# Patient Record
Sex: Female | Born: 1965
Health system: Southern US, Community
[De-identification: ages and names within clinical notes are randomized; demographics above are authoritative.]

## PROBLEM LIST (undated history)

## (undated) DIAGNOSIS — IMO0001 Reserved for inherently not codable concepts without codable children: Secondary | ICD-10-CM

## (undated) DIAGNOSIS — K929 Disease of digestive system, unspecified: Secondary | ICD-10-CM

## (undated) DIAGNOSIS — E785 Hyperlipidemia, unspecified: Secondary | ICD-10-CM

## (undated) DIAGNOSIS — S62639B Displaced fracture of distal phalanx of unspecified finger, initial encounter for open fracture: Secondary | ICD-10-CM

## (undated) DIAGNOSIS — M1990 Unspecified osteoarthritis, unspecified site: Secondary | ICD-10-CM

## (undated) DIAGNOSIS — M2550 Pain in unspecified joint: Secondary | ICD-10-CM

## (undated) HISTORY — DX: Unspecified osteoarthritis, unspecified site: M19.90

## (undated) HISTORY — DX: Displaced fracture of distal phalanx of unspecified finger, initial encounter for open fracture: S62.639B

## (undated) HISTORY — DX: Reserved for inherently not codable concepts without codable children: IMO0001

## (undated) HISTORY — DX: Hyperlipidemia, unspecified: E78.5

## (undated) HISTORY — DX: Pain in unspecified joint: M25.50

## (undated) HISTORY — DX: Disease of digestive system, unspecified: K92.9

---

## 2001-07-10 HISTORY — PX: PARTIAL HYSTERECTOMY: SHX80

## 2011-11-23 ENCOUNTER — Ambulatory Visit (INDEPENDENT_AMBULATORY_CARE_PROVIDER_SITE_OTHER): Payer: BC Managed Care – PPO | Admitting: Gynecology

## 2011-11-23 ENCOUNTER — Encounter: Payer: Self-pay | Admitting: Gynecology

## 2011-11-23 ENCOUNTER — Other Ambulatory Visit (HOSPITAL_COMMUNITY)
Admission: RE | Admit: 2011-11-23 | Discharge: 2011-11-23 | Disposition: A | Discharge status: Home / Self Care | Payer: BC Managed Care – PPO | Source: Ambulatory Visit | Attending: Gynecology | Admitting: Gynecology

## 2011-11-23 VITALS — BP 124/70 | Ht 61.5 in | Wt 118.0 lb

## 2011-11-23 DIAGNOSIS — Z01419 Encounter for gynecological examination (general) (routine) without abnormal findings: Secondary | ICD-10-CM

## 2011-11-23 DIAGNOSIS — Z1159 Encounter for screening for other viral diseases: Secondary | ICD-10-CM | POA: Insufficient documentation

## 2011-11-23 DIAGNOSIS — Z131 Encounter for screening for diabetes mellitus: Secondary | ICD-10-CM

## 2011-11-23 NOTE — Progress Notes (Signed)
Kathy Hodges 1965/12/29 [id number]        46 y.o.  G5 P4 Ab1 new patient for annual exam.  It's been several years since her last exam and she is without complaints.  Past medical history,surgical history, medications, allergies, family history and social history were all reviewed and documented in the EPIC chart. ROS:  Was performed and pertinent positives and negatives are included in the history.  Exam: Sherrilyn Rist chaperone present Filed Vitals:   11/23/11 1201  BP: 124/70   General appearance  Normal Skin grossly normal Head/Neck normal with no cervical or supraclavicular adenopathy thyroid normal Lungs  clear Cardiac RR, without RMG Abdominal  soft, nontender, without masses, organomegaly or hernia Breasts  examined lying and sitting without masses, retractions, discharge or axillary adenopathy. Pelvic  Ext/BUS/vagina  normal Pap of cuff done  Adnexa  Without masses or tenderness    Anus and perineum  normal   Rectovaginal  normal sphincter tone without palpated masses or tenderness.    Assessment/Plan:  46 y.o. female for annual exam.    1. History of abdominal hysterectomy for menorrhagia with abdominoplasty and hernia repair 2003. Doing well without menopausal symptoms. 2. Pap smear. She has no history of abnormal Pap smears before. No records of these. I did a Pap with HPV as a screen. Assuming this is negative I discussed stopping screening per current screening guidelines and she is status post hysterectomy for benign indications. 3. Mammography. Patient had her mammogram September 2012. To continue with annual mammography. SBE monthly reviewed. 4. Health maintenance. Baseline CBC glucose urinalysis ordered. She did have a lipid profile which was normal done to a cardiologist within the past year that she saw due to a brothers history of premature cardiac disease and her workup was negative. Assuming she continues well from a gynecologic standpoint she will see me in a year, sooner as  needed    Dara Lords MD, 12:44 PM 11/23/2011

## 2011-11-23 NOTE — Patient Instructions (Signed)
Follow up in one year for annual exam 

## 2011-11-24 LAB — CBC WITH DIFFERENTIAL/PLATELET
Basophils Absolute: 0 10*3/uL (ref 0.0–0.1)
Basophils Relative: 0 % (ref 0–1)
Eosinophils Relative: 1 % (ref 0–5)
HCT: 41.1 % (ref 36.0–46.0)
Hemoglobin: 12.9 g/dL (ref 12.0–15.0)
Lymphocytes Relative: 20 % (ref 12–46)
MCHC: 31.4 g/dL (ref 30.0–36.0)
MCV: 98.1 fL (ref 78.0–100.0)
Monocytes Absolute: 0.7 10*3/uL (ref 0.1–1.0)
Monocytes Relative: 9 % (ref 3–12)
Neutro Abs: 5.3 10*3/uL (ref 1.7–7.7)
RDW: 14.1 % (ref 11.5–15.5)

## 2011-11-24 LAB — URINALYSIS W MICROSCOPIC + REFLEX CULTURE
Bilirubin Urine: NEGATIVE
Casts: NONE SEEN
Glucose, UA: NEGATIVE mg/dL
Leukocytes, UA: NEGATIVE
Squamous Epithelial / LPF: NONE SEEN
pH: 5.5 (ref 5.0–8.0)

## 2012-05-13 ENCOUNTER — Emergency Department
Admission: EM | Admit: 2012-05-13 | Discharge: 2012-05-13 | Disposition: A | Discharge status: Home / Self Care | Payer: BC Managed Care – PPO | Source: Home / Self Care

## 2012-05-13 DIAGNOSIS — Z23 Encounter for immunization: Secondary | ICD-10-CM

## 2012-05-13 DIAGNOSIS — S62639B Displaced fracture of distal phalanx of unspecified finger, initial encounter for open fracture: Secondary | ICD-10-CM

## 2012-05-13 HISTORY — DX: Displaced fracture of distal phalanx of unspecified finger, initial encounter for open fracture: S62.639B

## 2012-05-13 MED ORDER — TETANUS-DIPHTH-ACELL PERTUSSIS 5-2.5-18.5 LF-MCG/0.5 IM SUSP
0.5000 mL | Freq: Once | INTRAMUSCULAR | Status: AC
  Administered 2012-05-13: 0.5 mL via INTRAMUSCULAR

## 2012-05-13 MED ORDER — INFLUENZA VAC TYP A&B SURF ANT IM INJ
0.5000 mL | INJECTION | INTRAMUSCULAR | Status: AC
  Administered 2012-05-13: 0.5 mL via INTRAMUSCULAR

## 2012-05-13 NOTE — ED Notes (Signed)
Kathy Hodges is here today for a flu and tetanus vaccine.

## 2012-05-29 DIAGNOSIS — IMO0001 Reserved for inherently not codable concepts without codable children: Secondary | ICD-10-CM

## 2012-05-29 HISTORY — DX: Reserved for inherently not codable concepts without codable children: IMO0001

## 2013-11-11 ENCOUNTER — Encounter: Payer: Self-pay | Admitting: Gynecology

## 2013-11-12 ENCOUNTER — Encounter: Payer: Self-pay | Admitting: Family Medicine

## 2013-11-12 ENCOUNTER — Ambulatory Visit (INDEPENDENT_AMBULATORY_CARE_PROVIDER_SITE_OTHER): Payer: BC Managed Care – PPO | Admitting: Family Medicine

## 2013-11-12 VITALS — BP 125/81 | HR 62 | Ht 61.5 in | Wt 115.0 lb

## 2013-11-12 DIAGNOSIS — R1011 Right upper quadrant pain: Secondary | ICD-10-CM

## 2013-11-12 DIAGNOSIS — Z13220 Encounter for screening for lipoid disorders: Secondary | ICD-10-CM

## 2013-11-12 DIAGNOSIS — R109 Unspecified abdominal pain: Secondary | ICD-10-CM

## 2013-11-12 DIAGNOSIS — D126 Benign neoplasm of colon, unspecified: Secondary | ICD-10-CM

## 2013-11-12 LAB — GAMMA GT: GGT: 11 U/L (ref 7–51)

## 2013-11-12 LAB — COMPLETE METABOLIC PANEL WITH GFR
ALBUMIN: 4.7 g/dL (ref 3.5–5.2)
ALK PHOS: 50 U/L (ref 39–117)
ALT: 11 U/L (ref 0–35)
AST: 16 U/L (ref 0–37)
BUN: 12 mg/dL (ref 6–23)
CALCIUM: 9.8 mg/dL (ref 8.4–10.5)
CHLORIDE: 101 meq/L (ref 96–112)
CO2: 22 mEq/L (ref 19–32)
Creat: 0.78 mg/dL (ref 0.50–1.10)
GFR, Est African American: >89 mL/min
GFR, Est Non African American: >89 mL/min
Glucose, Bld: 90 mg/dL (ref 70–99)
Potassium: 4.4 mEq/L (ref 3.5–5.3)
Sodium: 138 mEq/L (ref 135–145)
Total Bilirubin: 0.5 mg/dL (ref 0.2–1.2)
Total Protein: 7.6 g/dL (ref 6.0–8.3)

## 2013-11-12 NOTE — Progress Notes (Signed)
CC: Kathy Hodges is a 48 y.o. female is here for Establish Care   Subjective: HPI:  Very pleasant 48 year old here to establish care  Patient complains of generalized abdominal pain that has been present for the past 2 months it is described as moderate to severe occurs after eating certain foods and is described as a sickness and cramping. It can occur anytime of the day however has never woken her from sleep. It is worse with eating ham, yogurt, cheese burgers, french fries, fatty foods, or cheap cereal.  She is at a point now where her diet consists mostly of bread and water with occasional raisin bran occasional milk, while on this diet symptoms are manageable. Symptoms are accompanied by diarrhea which begins minutes after eating but does improve her pain to some degree. Symptoms came on abruptly. Interestingly she had identical symptoms approximately 20 years ago where she again had to eat nothing but bread and water for almost 2 years before symptoms resolved spontaneously without any intervention. She was given a remote diagnosis of pancreatic insufficiency but has never required enzyme supplementation.  Reports occasional nausea but no vomiting.  On direct questioning she does localize the majority of her pain to the right upper quadrant.  Interestingly she has a family history of polyposis with multiple family members require resection of her large colon. She had a colonoscopy in her early 31s or late teens that was entirely normal per her report   Review of Systems - General ROS: negative for - chills, fever, night sweats, weight gain or weight loss Ophthalmic ROS: negative for - decreased vision Psychological ROS: negative for - anxiety or depression ENT ROS: negative for - hearing change, nasal congestion, tinnitus or allergies Hematological and Lymphatic ROS: negative for - bleeding problems, bruising or swollen lymph nodes Breast ROS: negative Respiratory ROS: no cough, shortness of  breath, or wheezing Cardiovascular ROS: no chest pain or dyspnea on exertion Gastrointestinal ROS: no black or bloody stools Genito-Urinary ROS: negative for - genital discharge, genital ulcers, incontinence or abnormal bleeding from genitals Musculoskeletal ROS: negative for - joint pain or muscle pain Neurological ROS: negative for - headaches or memory loss Dermatological ROS: negative for lumps, mole changes, rash and skin lesion changes  Past Medical History  Diagnosis Date  . Hyperlipidemia   . Familial polyposis 11/12/2013    Past Surgical History  Procedure Laterality Date  . Cesarean section      x2  . Abdominal hysterectomy  2003    with ab muscle repair and hernia repair and bladder tacting for menorrahgia  . Partial hysterectomy  01/2002  . Bladder tack     Family History  Problem Relation Age of Onset  . Heart disease Father   . Other Father     familial polyposis  . Heart disease Brother 31    MI  . Other Brother     familial polyposis x 3  . Other Sister     familial polyposis x3    History   Social History  . Marital Status: Married    Spouse Name: N/A    Number of Children: N/A  . Years of Education: N/A   Occupational History  . Not on file.   Social History Main Topics  . Smoking status: Never Smoker   . Smokeless tobacco: Never Used  . Alcohol Use: Yes     Comment: periodically  . Drug Use: No  . Sexual Activity: Yes    Partners: Male  Birth Control/ Protection: Surgical   Other Topics Concern  . Not on file   Social History Narrative  . No narrative on file     Objective: BP 125/81  Pulse 62  Ht 5' 1.5" (1.562 m)  Wt 115 lb (52.164 kg)  BMI 21.38 kg/m2  General: Alert and Oriented, No Acute Distress HEENT: Pupils equal, round, reactive to light. Conjunctivae clear.  Moist membranes pharynx unremarkable Lungs: Clear to auscultation bilaterally, no wheezing/ronchi/rales.  Comfortable work of breathing. Good air  movement. Cardiac: Regular rate and rhythm. Normal S1/S2.  No murmurs, rubs, nor gallops.   Abdomen: Murphy sign positive, normal bowel sounds, no palpable masses in any quadrant. She is mild to moderately tender in all 4 quadrants without rebound guarding or rigidity Extremities: No peripheral edema.  Strong peripheral pulses.  Mental Status: No depression, anxiety, nor agitation. Skin: Warm and dry.  Assessment & Plan: Chayce was seen today for establish care.  Diagnoses and associated orders for this visit:  Familial polyposis  Abdominal pain, unspecified site - COMPLETE METABOLIC PANEL WITH GFR - Gamma GT - US Abdomen Complete; Future  Lipid screening - Lipid panel  RUQ pain - US Abdomen Complete; Future    Abdominal pain: Will rule out cholelithiasis/cholecystitis with ultrasound and labs above. If the above does not determine the source of the patient's discomfort we'll refer to gastroenterology for consideration of colonoscopy/endoscopy  Aside from above she is due for routine dyslipidemia screen which she will obtain headache future date.  Return if symptoms worsen or fail to improve.

## 2013-11-13 ENCOUNTER — Ambulatory Visit (INDEPENDENT_AMBULATORY_CARE_PROVIDER_SITE_OTHER): Payer: BC Managed Care – PPO

## 2013-11-13 ENCOUNTER — Telehealth: Payer: Self-pay | Admitting: Family Medicine

## 2013-11-13 ENCOUNTER — Telehealth: Payer: Self-pay | Admitting: *Deleted

## 2013-11-13 ENCOUNTER — Encounter: Payer: Self-pay | Admitting: Gastroenterology

## 2013-11-13 DIAGNOSIS — D126 Benign neoplasm of colon, unspecified: Secondary | ICD-10-CM

## 2013-11-13 DIAGNOSIS — R1011 Right upper quadrant pain: Secondary | ICD-10-CM

## 2013-11-13 DIAGNOSIS — R109 Unspecified abdominal pain: Secondary | ICD-10-CM

## 2013-11-13 DIAGNOSIS — R197 Diarrhea, unspecified: Secondary | ICD-10-CM

## 2013-11-13 LAB — LIPID PANEL
CHOL/HDL RATIO: 3.1 ratio
CHOLESTEROL: 192 mg/dL (ref 0–200)
HDL: 61 mg/dL (ref >39)
LDL Cholesterol: 117 mg/dL — ABNORMAL HIGH (ref 0–99)
Triglycerides: 68 mg/dL (ref <150)
VLDL: 14 mg/dL (ref 0–40)

## 2013-11-13 NOTE — Telephone Encounter (Signed)
Closed

## 2013-11-13 NOTE — Telephone Encounter (Signed)
Pt notified and would like to be scheduled with Dr. [name]. Note added to referral

## 2013-11-13 NOTE — Telephone Encounter (Signed)
Seth Bake, Will you please let patient know that labs and [place] did not show any abnormality. Although this is reassuring it doesn't explain her diarrhea therefore I've placed a GI referral for further workup.  Please let me know if not contacted about scheduling by next week.

## 2013-12-11 ENCOUNTER — Ambulatory Visit (INDEPENDENT_AMBULATORY_CARE_PROVIDER_SITE_OTHER): Payer: BC Managed Care – PPO | Admitting: Gynecology

## 2013-12-11 ENCOUNTER — Telehealth: Payer: Self-pay | Admitting: *Deleted

## 2013-12-11 ENCOUNTER — Encounter: Payer: Self-pay | Admitting: Gynecology

## 2013-12-11 VITALS — BP 116/74 | Ht 61.0 in | Wt 115.0 lb

## 2013-12-11 DIAGNOSIS — Z01419 Encounter for gynecological examination (general) (routine) without abnormal findings: Secondary | ICD-10-CM

## 2013-12-11 NOTE — Progress Notes (Signed)
Kathy Hodges [birth year] [id number]        48 y.o.  [id number] for annual exam.  Several issues that are below.  Past medical history,surgical history, problem list, medications, allergies, family history and social history were all reviewed and documented as reviewed in the EPIC chart.  ROS:  12 system ROS performed with pertinent positives and negatives included in the history, assessment and plan.  Included Systems: General, HEENT, Neck, Cardiovascular, Pulmonary, Gastrointestinal, Genitourinary, Musculoskeletal, Dermatologic, Endocrine, Hematological, Neurologic, Psychiatric Additional significant findings :  none   Exam: Kim assistant Filed Vitals:   12/11/13 1201  BP: 116/74  Height: 5\' 1"  (1.549 m)  Weight: 115 lb (52.164 kg)   General appearance:  Normal affect, orientation and appearance. Skin: Grossly normal HEENT: Without gross lesions.  No cervical or supraclavicular adenopathy. Thyroid normal.  Lungs:  Clear without wheezing, rales or rhonchi Cardiac: RR, without RMG Abdominal:  Soft, nontender, without masses, guarding, rebound, organomegaly or hernia Breasts:  Examined lying and sitting without masses, retractions, discharge or axillary adenopathy. Pelvic:  Ext/BUS/vagina normal   Adnexa  Without masses or tenderness    Anus and perineum  Normal   Rectovaginal  Normal sphincter tone without palpated masses or tenderness.    Assessment/Plan:  48 y.o. [id number] female for annual exam.   1. History of abdominal hysterectomy for menorrhagia and abdominal plasty and hernia repair 2003. Doing well without menopausal symptoms. Will continue to monitor. 2. Pap smear/HPV negative 2013. No Pap smear done today. Reviewed current screening guidelines. Options to stop screening she status post hysterectomy for benign indications versus less frequent screening intervals reviewed. Will readdress on an annual basis. No history of significant abnormal Pap smears previously. 3. History of  familial polyposis. Apparently was checked genetically a number of years ago but there is some question as to the results. Recommend followup with genetic counselor now. I reviewed the issue of Lynch syndrome and we will have genetic counselor see her to clarify the issue. 4. Mammography 11/2013. Continue with annual mammography. SBE monthly review. 5. Health maintenance. Recently had comprehensivemetabolic panelcomment lipid profile done. Mild elevated LDL discussed with her. Will check baseline CBC and urinalysis.  Followup with appointment for genetic counseling otherwise annually.   Note: This document was prepared with digital dictation and possible smart phrase technology. Any transcriptional errors that result from this process are unintentional.   Anastasio Auerbach MD, 12:22 PM 12/11/2013

## 2013-12-11 NOTE — Telephone Encounter (Signed)
Message copied by Thamas Jaegers on Thu Dec 11, 2013  2:09 PM ------      Message from: Anastasio Auerbach      Created: Thu Dec 11, 2013 12:26 PM       Appointment with oncology genetic counselor reference family history of colonic polyposis unclear about testing history. Rule out Lynch syndrome ------

## 2013-12-11 NOTE — Patient Instructions (Signed)
Office will contact you to arrange appointment with genetic counselor.  You may obtain a copy of any labs that were done today by logging onto MyChart as outlined in the instructions provided with your AVS (after visit summary). The office will not call with normal lab results but certainly if there are any significant abnormalities then we will contact you.   Health Maintenance, Female A healthy lifestyle and preventative care can promote health and wellness.  Maintain regular health, dental, and eye exams.  Eat a healthy diet. Foods like vegetables, fruits, whole grains, low-fat dairy products, and lean protein foods contain the nutrients you need without too many calories. Decrease your intake of foods high in solid fats, added sugars, and salt. Get information about a proper diet from your caregiver, if necessary.  Regular physical exercise is one of the most important things you can do for your health. Most adults should get at least 150 minutes of moderate-intensity exercise (any activity that increases your heart rate and causes you to sweat) each week. In addition, most adults need muscle-strengthening exercises on 2 or more days a week.   Maintain a healthy weight. The body mass index (BMI) is a screening tool to identify possible weight problems. It provides an estimate of body fat based on height and weight. Your caregiver can help determine your BMI, and can help you achieve or maintain a healthy weight. For adults 20 years and older:  A BMI below 18.5 is considered underweight.  A BMI of 18.5 to 24.9 is normal.  A BMI of 25 to 29.9 is considered overweight.  A BMI of 30 and above is considered obese.  Maintain normal blood lipids and cholesterol by exercising and minimizing your intake of saturated fat. Eat a balanced diet with plenty of fruits and vegetables. Blood tests for lipids and cholesterol should begin at age 98 and be repeated every 5 years. If your lipid or cholesterol  levels are high, you are over 50, or you are a high risk for heart disease, you may need your cholesterol levels checked more frequently.Ongoing high lipid and cholesterol levels should be treated with medicines if diet and exercise are not effective.  If you smoke, find out from your caregiver how to quit. If you do not use tobacco, do not start.  Lung cancer screening is recommended for adults aged 83 80 years who are at high risk for developing lung cancer because of a history of smoking. Yearly low-dose computed tomography (CT) is recommended for people who have at least a 30-pack-year history of smoking and are a current smoker or have quit within the past 15 years. A pack year of smoking is smoking an average of 1 pack of cigarettes a day for 1 year (for example: 1 pack a day for 30 years or 2 packs a day for 15 years). Yearly screening should continue until the smoker has stopped smoking for at least 15 years. Yearly screening should also be stopped for people who develop a health problem that would prevent them from having lung cancer treatment.  If you are pregnant, do not drink alcohol. If you are breastfeeding, be very cautious about drinking alcohol. If you are not pregnant and choose to drink alcohol, do not exceed 1 drink per day. One drink is considered to be 12 ounces (355 mL) of beer, 5 ounces (148 mL) of wine, or 1.5 ounces (44 mL) of liquor.  Avoid use of street drugs. Do not share needles with anyone.  Ask for help if you need support or instructions about stopping the use of drugs.  High blood pressure causes heart disease and increases the risk of stroke. Blood pressure should be checked at least every 1 to 2 years. Ongoing high blood pressure should be treated with medicines, if weight loss and exercise are not effective.  If you are 18 to 48 years old, ask your caregiver if you should take aspirin to prevent strokes.  Diabetes screening involves taking a blood sample to check  your fasting blood sugar level. This should be done once every 3 years, after age 41, if you are within normal weight and without risk factors for diabetes. Testing should be considered at a younger age or be carried out more frequently if you are overweight and have at least 1 risk factor for diabetes.  Breast cancer screening is essential preventative care for women. You should practice "breast self-awareness." This means understanding the normal appearance and feel of your breasts and may include breast self-examination. Any changes detected, no matter how small, should be reported to a caregiver. Women in their 27s and 30s should have a clinical breast exam (CBE) by a caregiver as part of a regular health exam every 1 to 3 years. After age 38, women should have a CBE every year. Starting at age 1, women should consider having a mammogram (breast X-ray) every year. Women who have a family history of breast cancer should talk to their caregiver about genetic screening. Women at a high risk of breast cancer should talk to their caregiver about having an MRI and a mammogram every year.  Breast cancer gene (BRCA)-related cancer risk assessment is recommended for women who have family members with BRCA-related cancers. BRCA-related cancers include breast, ovarian, tubal, and peritoneal cancers. Having family members with these cancers may be associated with an increased risk for harmful changes (mutations) in the breast cancer genes BRCA1 and BRCA2. Results of the assessment will determine the need for genetic counseling and BRCA1 and BRCA2 testing.  The Pap test is a screening test for cervical cancer. Women should have a Pap test starting at age 3. Between ages 87 and 43, Pap tests should be repeated every 2 years. Beginning at age 47, you should have a Pap test every 3 years as long as the past 3 Pap tests have been normal. If you had a hysterectomy for a problem that was not cancer or a condition that  could lead to cancer, then you no longer need Pap tests. If you are between ages 1 and 72, and you have had normal Pap tests going back 10 years, you no longer need Pap tests. If you have had past treatment for cervical cancer or a condition that could lead to cancer, you need Pap tests and screening for cancer for at least 20 years after your treatment. If Pap tests have been discontinued, risk factors (such as a new sexual partner) need to be reassessed to determine if screening should be resumed. Some women have medical problems that increase the chance of getting cervical cancer. In these cases, your caregiver may recommend more frequent screening and Pap tests.  The human papillomavirus (HPV) test is an additional test that may be used for cervical cancer screening. The HPV test looks for the virus that can cause the cell changes on the cervix. The cells collected during the Pap test can be tested for HPV. The HPV test could be used to screen women aged 75 years  and older, and should be used in women of any age who have unclear Pap test results. After the age of 30, women should have HPV testing at the same frequency as a Pap test.  Colorectal cancer can be detected and often prevented. Most routine colorectal cancer screening begins at the age of 77 and continues through age 49. However, your caregiver may recommend screening at an earlier age if you have risk factors for colon cancer. On a yearly basis, your caregiver may provide home test kits to check for hidden blood in the stool. Use of a small camera at the end of a tube, to directly examine the colon (sigmoidoscopy or colonoscopy), can detect the earliest forms of colorectal cancer. Talk to your caregiver about this at age 45, when routine screening begins. Direct examination of the colon should be repeated every 5 to 10 years through age 28, unless early forms of pre-cancerous polyps or small growths are found.  Hepatitis C blood testing is  recommended for all people born from [birth year] through 1965 and any individual with known risks for hepatitis C.  Practice safe sex. Use condoms and avoid high-risk sexual practices to reduce the spread of sexually transmitted infections (STIs). Sexually active women aged 73 and younger should be checked for Chlamydia, which is a common sexually transmitted infection. Older women with new or multiple partners should also be tested for Chlamydia. Testing for other STIs is recommended if you are sexually active and at increased risk.  Osteoporosis is a disease in which the bones lose minerals and strength with aging. This can result in serious bone fractures. The risk of osteoporosis can be identified using a bone density scan. Women ages 71 and over and women at risk for fractures or osteoporosis should discuss screening with their caregivers. Ask your caregiver whether you should be taking a calcium supplement or vitamin D to reduce the rate of osteoporosis.  Menopause can be associated with physical symptoms and risks. Hormone replacement therapy is available to decrease symptoms and risks. You should talk to your caregiver about whether hormone replacement therapy is right for you.  Use sunscreen. Apply sunscreen liberally and repeatedly throughout the day. You should seek shade when your shadow is shorter than you. Protect yourself by wearing long sleeves, pants, a wide-brimmed hat, and sunglasses year round, whenever you are outdoors.  Notify your caregiver of new moles or changes in moles, especially if there is a change in shape or color. Also notify your caregiver if a mole is larger than the size of a pencil eraser.  Stay current with your immunizations. Document Released: 01/09/2011 Document Revised: 10/21/2012 Document Reviewed: 01/09/2011 [facility] Patient Information 2014 [place].

## 2013-12-11 NOTE — Telephone Encounter (Signed)
Referral faxed to cancer center, they will contact patient to schedule. 

## 2013-12-12 ENCOUNTER — Telehealth: Payer: Self-pay | Admitting: Genetic Counselor

## 2013-12-12 LAB — URINALYSIS W MICROSCOPIC + REFLEX CULTURE
Bacteria, UA: NONE SEEN
Bilirubin Urine: NEGATIVE
Casts: NONE SEEN
Crystals: NONE SEEN
Glucose, UA: NEGATIVE mg/dL
Hgb urine dipstick: NEGATIVE
Ketones, ur: NEGATIVE mg/dL
Leukocytes, UA: NEGATIVE
Nitrite: NEGATIVE
Protein, ur: NEGATIVE mg/dL
Specific Gravity, Urine: 1.005 (ref 1.005–1.030)
Squamous Epithelial / HPF: NONE SEEN
Urobilinogen, UA: 0.2 mg/dL (ref 0.0–1.0)
pH: 6.5 (ref 5.0–8.0)

## 2013-12-12 NOTE — Telephone Encounter (Signed)
Appointment on 01/06/14 at [facility].

## 2013-12-12 NOTE — Telephone Encounter (Signed)
S/W PATIENT AND GAVE NP APPT FOR 06/30 @ 9 W/GENETIC COUNSELOR.  REFERRING DR. Phineas Real DX- FHX OF COLON CA WELCOME PACKET MAILED.

## 2014-01-06 ENCOUNTER — Other Ambulatory Visit: Payer: BC Managed Care – PPO

## 2014-01-06 ENCOUNTER — Ambulatory Visit (HOSPITAL_BASED_OUTPATIENT_CLINIC_OR_DEPARTMENT_OTHER): Payer: BC Managed Care – PPO | Admitting: Genetic Counselor

## 2014-01-06 ENCOUNTER — Encounter: Payer: Self-pay | Admitting: Genetic Counselor

## 2014-01-06 DIAGNOSIS — Z8371 Family history of colonic polyps: Secondary | ICD-10-CM

## 2014-01-06 NOTE — Progress Notes (Signed)
Patient Name: Kathy Hodges Patient Age: 48 y.o. Encounter Date: 01/06/2014  Referring Physician: Donalynn Furlong, MD  Primary Care Provider: Marcial Pacas, DO   [name], a 48 y.o. female, is being seen at the [facility] due to a family history of Familial Adenomatous Polyposis (FAP) and a pathogenic APC mutation in her family.  She presents to clinic today to obtain information about FAP and discuss genetic testing.  HISTORY OF PRESENT ILLNESS: [name] has no personal history of cancer. She stated that she used to have yearly colon screening when she was in her teens and 57s, but that her last colonoscopy was at age 76. It was negative for polyps and she was told to discontinue yearly screening. She has not had genetic testing for the familial APC mutation identified in several of her siblings: APC 65insT (old nomenclature).  She stated that she has a yearly gynecologic exam and clinical breast exam. She has a mammogram every 1-2 years.  Past Medical History  Diagnosis Date  . Hyperlipidemia   . Digestive problems   . Family history of FAP (familial adenomatous polyposis)     siblings with APC mutation    Past Surgical History  Procedure Laterality Date  . Cesarean section      x2  . Abdominal hysterectomy  2003    with ab muscle repair and hernia repair and bladder tacting for menorrahgia  . Partial hysterectomy  01/2002  . Bladder tack      History   Social History  . Marital Status: Married    Spouse Name: N/A    Number of Children: N/A  . Years of Education: N/A   Social History Main Topics  . Smoking status: Never Smoker   . Smokeless tobacco: Never Used  . Alcohol Use: Yes     Comment: periodically  . Drug Use: No  . Sexual Activity: Yes    Partners: Male    Birth Control/ Protection: Surgical     Comment: HYST   Other Topics Concern  . Not on file   Social History Narrative  . No narrative on file     FAMILY HISTORY:   During  the visit, a 4-generation pedigree was obtained. Significant diagnoses include the following:  Family History  Problem Relation Age of Onset  . Heart disease Father   . Other Father     familial polyposis  . Heart disease Brother 76    MI  . Other Brother     2 brothers with FAP; colectomy in teens; APC positive  . Other Sister     4 sisters with FAP; colectomy in teens; APC positive  . Colon cancer Paternal Uncle 65    deceased    Additionally, [name] has 2 sons (ages 43 and 46) and 2 daughters (ages 35 and 86).   [name] ancestry is [place] and [place]. There is no known Jewish ancestry and no consanguinity.  ASSESSMENT AND PLAN: [name] is a 48 y.o. female with a family history of FAP and a pathogenic APC mutation identified in several siblings. Her last colon screening was at age 8 and she has not had genetic testing. We discussed that while she likely did not inherit the mutation given her history of negative colon screening, testing is strongly recommended to confirm this. We reviewed the characteristics, features and inheritance patterns of FAP. We also discussed genetic testing, including the other appropriate family members to test, the process of testing, insurance  coverage and implications of results. She understood that if her test is negative, there is no reason to offer testing to her children.  [name] wished to pursue genetic testing and a blood sample will be sent to [facility] for site-specific analysis for the familial APC mutation. We discussed the implications of a positive and negative result. Results should be available in approximately 4 weeks, at which point we will contact her and address implications for her as well as address genetic testing for at-risk family members, as needed.    We encouraged [name] to remain in contact with Cancer Genetics annually so that we can update the family history and inform her of any changes in cancer genetics and  testing that may be of benefit for this family. Ms.  Kathy Hodges questions were answered to her satisfaction today.   Thank you for the referral and allowing [place] to share in the care of your patient.   The patient was seen for a total of 30 minutes, greater than 50% of which was spent face-to-face counseling. This patient was discussed with the overseeing provider who agrees with the above.

## 2014-01-11 ENCOUNTER — Encounter: Payer: Self-pay | Admitting: Family Medicine

## 2014-01-19 ENCOUNTER — Other Ambulatory Visit (INDEPENDENT_AMBULATORY_CARE_PROVIDER_SITE_OTHER): Payer: BC Managed Care – PPO

## 2014-01-19 ENCOUNTER — Encounter: Payer: Self-pay | Admitting: Gastroenterology

## 2014-01-19 ENCOUNTER — Ambulatory Visit (INDEPENDENT_AMBULATORY_CARE_PROVIDER_SITE_OTHER): Payer: BC Managed Care – PPO | Admitting: Gastroenterology

## 2014-01-19 VITALS — BP 110/60 | HR 78 | Ht 61.0 in | Wt 115.0 lb

## 2014-01-19 DIAGNOSIS — R1031 Right lower quadrant pain: Secondary | ICD-10-CM

## 2014-01-19 LAB — COMPREHENSIVE METABOLIC PANEL
ALK PHOS: 42 U/L (ref 39–117)
ALT: 15 U/L (ref 0–35)
AST: 18 U/L (ref 0–37)
Albumin: 4.3 g/dL (ref 3.5–5.2)
BUN: 12 mg/dL (ref 6–23)
CHLORIDE: 106 meq/L (ref 96–112)
CO2: 26 meq/L (ref 19–32)
CREATININE: 0.8 mg/dL (ref 0.4–1.2)
Calcium: 9.3 mg/dL (ref 8.4–10.5)
GFR: 87.68 mL/min (ref >60.00)
GLUCOSE: 105 mg/dL — AB (ref 70–99)
POTASSIUM: 4 meq/L (ref 3.5–5.1)
SODIUM: 139 meq/L (ref 135–145)
Total Bilirubin: 0.8 mg/dL (ref 0.2–1.2)
Total Protein: 7.5 g/dL (ref 6.0–8.3)

## 2014-01-19 LAB — CBC WITH DIFFERENTIAL/PLATELET
BASOS ABS: 0 10*3/uL (ref 0.0–0.1)
Basophils Relative: 0 % (ref 0.0–3.0)
EOS PCT: 0.4 % (ref 0.0–5.0)
Eosinophils Absolute: 0 10*3/uL (ref 0.0–0.7)
HCT: 42.4 % (ref 36.0–46.0)
Hemoglobin: 13.7 g/dL (ref 12.0–15.0)
LYMPHS ABS: 1.3 10*3/uL (ref 0.7–4.0)
LYMPHS PCT: 23.2 % (ref 12.0–46.0)
MCHC: 32.3 g/dL (ref 30.0–36.0)
MCV: 94.3 fl (ref 78.0–100.0)
MONOS PCT: 7.9 % (ref 3.0–12.0)
Monocytes Absolute: 0.4 10*3/uL (ref 0.1–1.0)
Neutro Abs: 3.9 10*3/uL (ref 1.4–7.7)
Neutrophils Relative %: 68.5 % (ref 43.0–77.0)
PLATELETS: 195 10*3/uL (ref 150.0–400.0)
RBC: 4.49 Mil/uL (ref 3.87–5.11)
RDW: 14.5 % (ref 11.5–15.5)
WBC: 5.6 10*3/uL (ref 4.0–10.5)

## 2014-01-19 LAB — SEDIMENTATION RATE: Sed Rate: 10 mm/hr (ref 0–22)

## 2014-01-19 NOTE — Progress Notes (Signed)
HPI: This is a   very pleasant 48 year old woman whom I am meeting for the first time today.   Siblings with APC mutation.  She had genetic testing done 2 weeks ago.  Colonoscopy 20 years ago was normal.  From age [age] she had them annually and they were all normal.  She stopped having colonoscopy since no polyps ever noted.  Her uncle had colon cancer. 9 siblings: 6 of them have had their colons removed.  A niece has FAP.  She has had right lower quad pains, yogurt and lunchmeat. Eating particular foods cause the pains (high fat pains, grapes).  Often within minutes of eating.    20 years ago, diarrhea as well; elimination diet.  Ate bread a and water only for about two years.  Currently she has alternating loose, constipated stools. No overt GI bleeding.  Overall stable weight (maybe 4-5 pounds down in 6 months).  No fevers or chills.  [place] recently 11/2013 was normal.  VEry rare nsaids.  No PPI  NO GERD symptoms.    Review of systems: Pertinent positive and negative review of systems were noted in the above HPI section. Complete review of systems was performed and was otherwise normal.    Past Medical History  Diagnosis Date  . Hyperlipidemia   . Digestive problems   . Family history of FAP (familial adenomatous polyposis)     siblings with APC mutation    Past Surgical History  Procedure Laterality Date  . Cesarean section      x2  . Partial hysterectomy  2003    with ab muscle repair and hernia repair and bladder tacting for menorrahgia    Current Outpatient Prescriptions  Medication Sig Dispense Refill  . ibuprofen (ADVIL,MOTRIN) 200 MG tablet Take 200 mg by mouth as needed.       No current facility-administered medications for this visit.    Allergies as of 01/19/2014  . (No Known Allergies)    Family History  Problem Relation Age of Onset  . Heart disease Father   . Other Father     familial polyposis  . Heart disease Brother 66    MI  . Other  Brother     2 brothers with FAP; colectomy in teens; APC positive  . Other Sister     4 sisters with FAP; colectomy in teens; APC positive  . Colon cancer Paternal Uncle 57    deceased    History   Social History  . Marital Status: Married    Spouse Name: N/A    Number of Children: N/A  . Years of Education: N/A   Occupational History  . Not on file.   Social History Main Topics  . Smoking status: Never Smoker   . Smokeless tobacco: Never Used  . Alcohol Use: Yes     Comment: periodically  . Drug Use: No  . Sexual Activity: Yes    Partners: Male    Birth Control/ Protection: Surgical     Comment: HYST   Other Topics Concern  . Not on file   Social History Narrative  . No narrative on file       Physical Exam: Ht 5\' 1"  (1.549 m)  Wt 115 lb (52.164 kg)  BMI 21.74 kg/m2 Constitutional: generally well-appearing Psychiatric: alert and oriented x3 Eyes: extraocular movements intact Mouth: oral pharynx moist, no lesions Neck: supple no lymphadenopathy Cardiovascular: heart regular rate and rhythm Lungs: clear to auscultation bilaterally Abdomen: soft,  very slightly tender  in the right lower quadrant, nondistended, no obvious ascites, no peritoneal signs, normal bowel sounds Extremities: no lower extremity edema bilaterally Skin: no lesions on visible extremities    Assessment and plan: 48 y.o. female with  right lower quadrant postprandial discomforts  She is an interesting family history with familial adenomatous polyposis syndrome, 6/9 of her siblings have had their colons resected by the age of 33-16. She had colonoscopies until she was 80 and then they were discontinued since she was never found to phenotypically express disease. Now she has right lower quadrant discomforts after she eats. Recent ultrasound was normal.  I would like to start the workup with CT scan abdomen and pelvis. Did explain to her that she would possibly require further workup including  colonoscopy, upper endoscopy pending the results a CAT scan and some basic lab work including CBC, complete metabolic profile and sedimentation rate. Her genetic testing is pending and she will forward any positive, and poor nutrition to me after she hears.

## 2014-01-19 NOTE — Patient Instructions (Addendum)
You will have labs checked today in the basement lab.  Please head down after you check out with the front desk  (cbc, cmet, esr). You will be set up for a CT scan of abdomen and pelvis with IV and oral contrast for RLQ pains.  You have been scheduled for a CT scan of the abdomen and pelvis at [facility] ([address]---this is in the same building as [facility]).   You are scheduled on 01/21/14 at 1 pm. You should arrive 15 minutes prior to your appointment time for registration. Please follow the written instructions below on the day of your exam:  WARNING: IF YOU ARE ALLERGIC TO IODINE/X-RAY DYE, PLEASE NOTIFY RADIOLOGY IMMEDIATELY AT [phone]! YOU WILL BE GIVEN A 13 HOUR PREMEDICATION PREP.  1) Do not eat or drink anything after 9 am (4 hours prior to your test) 2) You have been given 2 bottles of oral contrast to drink. The solution may taste better if refrigerated, but do NOT add ice or any other liquid to this solution. Shake well before drinking.    Drink 1 bottle of contrast @ 11 am (2 hours prior to your exam)  Drink 1 bottle of contrast @ 12 pm  (1 hour prior to your exam)  You may take any medications as prescribed with a small amount of water except for the following: Metformin, Glucophage, Glucovance, Avandamet, Riomet, Fortamet, Actoplus Met, Janumet, Glumetza or Metaglip. The above medications must be held the day of the exam AND 48 hours after the exam.  The purpose of you drinking the oral contrast is to aid in the visualization of your intestinal tract. The contrast solution may cause some diarrhea. Before your exam is started, you will be given a small amount of fluid to drink. Depending on your individual set of symptoms, you may also receive an intravenous injection of x-ray contrast/dye. Plan on being at [facility] for 30 minutes or long, depending on the type of exam you are having performed.  This test typically takes 30-45 minutes to  complete.  If you have any questions regarding your exam or if you need to reschedule, you may call the CT department at [phone] between the hours of 8:00 am and 5:00 pm, Monday-Friday.  ________________________________________________________________________  

## 2014-01-21 ENCOUNTER — Ambulatory Visit (INDEPENDENT_AMBULATORY_CARE_PROVIDER_SITE_OTHER)
Admission: RE | Admit: 2014-01-21 | Discharge: 2014-01-21 | Disposition: A | Discharge status: Home / Self Care | Payer: BC Managed Care – PPO | Source: Ambulatory Visit | Attending: Gastroenterology | Admitting: Gastroenterology

## 2014-01-21 DIAGNOSIS — R1031 Right lower quadrant pain: Secondary | ICD-10-CM

## 2014-01-21 MED ORDER — IOHEXOL 300 MG/ML  SOLN
80.0000 mL | Freq: Once | INTRAMUSCULAR | Status: AC | PRN
  Administered 2014-01-21: 80 mL via INTRAVENOUS

## 2014-01-26 ENCOUNTER — Encounter: Payer: Self-pay | Admitting: Genetic Counselor

## 2014-01-26 NOTE — Progress Notes (Signed)
Referring Physician: Donalynn Furlong, MD  Ms. Rabago was called today to discuss genetic test results. Please see the Genetics note from her visit on 01/06/14 for a detailed discussion of her personal and family history.  GENETIC TESTING: We recommended Ms. Reddick pursue testing for the familial gene mutation called APC [id number] (also known as 1886insT). Ms. Hollingworth's test, which was performed at [facility], did not reveal this mutation previously identified in her family. We call this result a true negative result because the cancer-causing mutation was identified in Ms. Lawes's family, and she did not inherit it. Given this negative result, Ms. Summerson's chances of developing APC-related cancers are the same as they are in the general population. She understood that her children do not need testing for this mutation.  CANCER SCREENING: This normal result is reassuring and indicates that Ms. Schmoker does not have an increased risk of cancer due to a mutation in the APC gene. We recommended Ms. Leflore continue to follow the cancer screening guidelines provided by her primary physician.   FAMILY MEMBERS: Even though Ms. Whisnant did not inherit the familial gene mutation, others in the family may have. It is very important all of Ms. Chaudoin's relatives (with the exception of her children) know of the presence of this mutation and that they may be at increased risk for cancer. Genetic testing can sort out who in your family is at risk and who is not.  Please let us know if we can help facilitate testing. Genetic counselors can be located in other cities, by visiting the website of the [employer] ([employer]se) and Field seismologist for a Dietitian by zip code.  Lastly, we discussed with Ms. Blust that cancer genetics is a rapidly advancing field and it is possible that new genetic tests will be appropriate for her in the future. We encouraged her to remain in contact with [place] on an annual basis so  we can update her personal and family histories, and let her know of advances in cancer genetics that may benefit the family. Our contact number was provided. Ms. Hege questions were answered to her satisfaction today, and she knows she is welcome to call anytime with additional questions.    Steele Berg, MS, [place] Certified Genetic Counseor phone: [phone] [email or web address]

## 2014-02-04 ENCOUNTER — Ambulatory Visit (AMBULATORY_SURGERY_CENTER): Payer: Self-pay | Admitting: *Deleted

## 2014-02-04 VITALS — Ht 61.5 in | Wt 115.6 lb

## 2014-02-04 DIAGNOSIS — R1031 Right lower quadrant pain: Secondary | ICD-10-CM

## 2014-02-04 MED ORDER — MOVIPREP 100 G PO SOLR: ORAL | Status: DC

## 2014-02-04 NOTE — Progress Notes (Signed)
No allergies to eggs or soy. No problems with anesthesia.  Pt given Emmi instructions for colonoscopy  No oxygen use  No diet drug use  

## 2014-02-11 ENCOUNTER — Encounter: Payer: Self-pay | Admitting: Gastroenterology

## 2014-02-11 ENCOUNTER — Ambulatory Visit (AMBULATORY_SURGERY_CENTER): Payer: BC Managed Care – PPO | Admitting: Gastroenterology

## 2014-02-11 VITALS — BP 118/69 | HR 51 | Temp 99.0°F | Resp 19 | Ht 61.0 in | Wt 115.0 lb

## 2014-02-11 DIAGNOSIS — R1031 Right lower quadrant pain: Secondary | ICD-10-CM

## 2014-02-11 DIAGNOSIS — R198 Other specified symptoms and signs involving the digestive system and abdomen: Secondary | ICD-10-CM

## 2014-02-11 DIAGNOSIS — K599 Functional intestinal disorder, unspecified: Secondary | ICD-10-CM

## 2014-02-11 DIAGNOSIS — K2990 Gastroduodenitis, unspecified, without bleeding: Secondary | ICD-10-CM

## 2014-02-11 DIAGNOSIS — K2970 Gastritis, unspecified, without bleeding: Secondary | ICD-10-CM

## 2014-02-11 DIAGNOSIS — R933 Abnormal findings on diagnostic imaging of other parts of digestive tract: Secondary | ICD-10-CM

## 2014-02-11 MED ORDER — FLEET ENEMA 7-19 GM/118ML RE ENEM
1.0000 | ENEMA | Freq: Once | RECTAL | Status: AC
  Administered 2014-02-11: 1 via RECTAL

## 2014-02-11 MED ORDER — SODIUM CHLORIDE 0.9 % IV SOLN: 500.0000 mL | INTRAVENOUS | Status: DC

## 2014-02-11 NOTE — Progress Notes (Signed)
Patient stating she took Moviprep as directed with last bowel movement brown liquid with solid stool present. Informed Dr. Ardis Hughs, fleets enema. Fleets enema administered by Margie Ege RN

## 2014-02-11 NOTE — Patient Instructions (Signed)
Discharge instructions given with verbal understanding. Biopsies taken. Resume previous medications. YOU HAD AN ENDOSCOPIC PROCEDURE TODAY AT THE [facility]: Refer to the procedure report that was given to you for any specific questions about what was found during the examination.  If the procedure report does not answer your questions, please call your gastroenterologist to clarify.  If you requested that your care partner not be given the details of your procedure findings, then the procedure report has been included in a sealed envelope for you to review at your convenience later.  YOU SHOULD EXPECT: Some feelings of bloating in the abdomen. Passage of more gas than usual.  Walking can help get rid of the air that was put into your GI tract during the procedure and reduce the bloating. If you had a lower endoscopy (such as a colonoscopy or flexible sigmoidoscopy) you may notice spotting of blood in your stool or on the toilet paper. If you underwent a bowel prep for your procedure, then you may not have a normal bowel movement for a few days.  DIET: Your first meal following the procedure should be a light meal and then it is ok to progress to your normal diet.  A half-sandwich or bowl of soup is an example of a good first meal.  Heavy or fried foods are harder to digest and may make you feel nauseous or bloated.  Likewise meals heavy in dairy and vegetables can cause extra gas to form and this can also increase the bloating.  Drink plenty of fluids but you should avoid alcoholic beverages for 24 hours.  ACTIVITY: Your care partner should take you home directly after the procedure.  You should plan to take it easy, moving slowly for the rest of the day.  You can resume normal activity the day after the procedure however you should NOT DRIVE or use heavy machinery for 24 hours (because of the sedation medicines used during the test).    SYMPTOMS TO REPORT IMMEDIATELY: A gastroenterologist  can be reached at any hour.  During normal business hours, 8:30 AM to 5:00 PM Monday through Friday, call [phone].  After hours and on weekends, please call the GI answering service at [phone] who will take a message and have the physician on call contact you.   Following lower endoscopy (colonoscopy or flexible sigmoidoscopy):  Excessive amounts of blood in the stool  Significant tenderness or worsening of abdominal pains  Swelling of the abdomen that is new, acute  Fever of 100F or higher  Following upper endoscopy (EGD)  Vomiting of blood or coffee ground material  New chest pain or pain under the shoulder blades  Painful or persistently difficult swallowing  New shortness of breath  Fever of 100F or higher  Black, tarry-looking stools  FOLLOW UP: If any biopsies were taken you will be contacted by phone or by letter within the next 1-3 weeks.  Call your gastroenterologist if you have not heard about the biopsies in 3 weeks.  Our staff will call the home number listed on your records the next business day following your procedure to check on you and address any questions or concerns that you may have at that time regarding the information given to you following your procedure. This is a courtesy call and so if there is no answer at the home number and we have not heard from you through the emergency physician on call, we will assume that you have returned to your   regular daily activities without incident.  SIGNATURES/CONFIDENTIALITY: You and/or your care partner have signed paperwork which will be entered into your electronic medical record.  These signatures attest to the fact that that the information above on your After Visit Summary has been reviewed and is understood.  Full responsibility of the confidentiality of this discharge information lies with you and/or your care-partner. 

## 2014-02-11 NOTE — Op Note (Signed)
[facility]  [address] [place], [id number]   ENDOSCOPY PROCEDURE REPORT  PATIENT: Kathy, Hodges  MR#: [id number] BIRTHDATE: [birth year] , 48  yrs. old GENDER: Female ENDOSCOPIST: Milus Banister, MD PROCEDURE DATE:  02/11/2014 PROCEDURE:  EGD w/ biopsy ASA CLASS:     Class II INDICATIONS:  abdominal pain, often post-prandial. MEDICATIONS: MAC sedation, administered by CRNA and propofol (Diprivan) 50mg  IV TOPICAL ANESTHETIC: none  DESCRIPTION OF PROCEDURE: After the risks benefits and alternatives of the procedure were thoroughly explained, informed consent was obtained.  The LB [id number] endoscope was introduced through the mouth and advanced to the second portion of the duodenum. Without limitations.  The instrument was slowly withdrawn as the mucosa was fully examined.   There was mild, non-specific distal gastritis.  This was biopsied and sent to pathology.  The examination was otherwise normal. Retroflexed views revealed no abnormalities.     The scope was then withdrawn from the patient and the procedure completed. COMPLICATIONS: There were no complications.  ENDOSCOPIC IMPRESSION: There was mild, non-specific distal gastritis.  This was biopsied and sent to pathology.  The examination was otherwise normal.  RECOMMENDATIONS: If biopsies show H.  pylori, you will be started on appropriat antibiotics.   eSigned:  Milus Banister, MD 02/11/2014 3:17 PM   CC: Marcial Pacas, MD

## 2014-02-11 NOTE — Progress Notes (Signed)
Called to room to assist during endoscopic procedure.  Patient ID and intended procedure confirmed with present staff. Received instructions for my participation in the procedure from the performing physician.  

## 2014-02-11 NOTE — Progress Notes (Signed)
Enema results- yellow liquid with brown sediment. Dr Ardis Hughs informed by Margaretmary Eddy RN. Will proceed with procedure.

## 2014-02-11 NOTE — Progress Notes (Signed)
A/ox3, pleased with MAC, report to RN 

## 2014-02-11 NOTE — Op Note (Signed)
[facility]  [address] [place], [id number]   COLONOSCOPY PROCEDURE REPORT  PATIENT: Kathy Hodges, Kathy Hodges  MR#: [id number] BIRTHDATE: 11-Apr-1966 , 48  yrs. old GENDER: Female ENDOSCOPIST: Milus Banister, MD REFERRED BY: Marcial Pacas, MD PROCEDURE DATE:  02/11/2014 PROCEDURE:   Colonoscopy with biopsy First Screening Colonoscopy - Avg.  risk and is 50 yrs.  old or older - No.  Prior Negative Screening - Now for repeat screening. N/A  History of Adenoma - Now for follow-up colonoscopy & has been > or = to 3 yrs.  N/A  Polyps Removed Today? No.  Recommend repeat exam, <10 yrs? No. ASA CLASS:   Class II INDICATIONS:RLQ pain, alternating bowel habits. MEDICATIONS: MAC sedation, administered by CRNA and propofol (Diprivan) 250mg  IV  DESCRIPTION OF PROCEDURE:   After the risks benefits and alternatives of the procedure were thoroughly explained, informed consent was obtained.  A digital rectal exam revealed no abnormalities of the rectum.   The LB [id number]  endoscope was introduced through the anus and advanced to the terminal ileum which was intubated for a short distance. No adverse events experienced.   The quality of the prep was excellent.  The instrument was then slowly withdrawn as the colon was fully examined.   COLON FINDINGS: The mucosa in the rectum was slightly erythematous. I suspect this from the enema she received as part of her prep but biopsies taken to rule out chronic inflammation.  The examination was otherwise normal.  Retroflexed views revealed no abnormalities. The time to cecum=2 minutes 49 seconds.  Withdrawal time=8 minutes 19 seconds.  The scope was withdrawn and the procedure completed. COMPLICATIONS: There were no complications.  ENDOSCOPIC IMPRESSION: The mucosa in the rectum was slightly erythematous.  I suspect this from the enema she received as part of her prep but biopsies taken to rule out chronic inflammation.  The  examination was otherwise normal.  RECOMMENDATIONS: Await final biopsies to rule out colitis.  For now, please start once dailyl citrucel orange powder fiber supplement to help with her alternating constipation, loose stools.   eSigned:  Milus Banister, MD 02/11/2014 3:15 PM

## 2014-02-12 ENCOUNTER — Telehealth: Payer: Self-pay | Admitting: *Deleted

## 2014-02-12 NOTE — Telephone Encounter (Signed)
  Follow up Call-  Call back number 02/11/2014  Post procedure Call Back phone  # [phone]  Permission to leave phone message Yes     Patient questions:  Do you have a fever, pain , or abdominal swelling? No. Pain Score  0 *  Have you tolerated food without any problems? Yes.    Have you been able to return to your normal activities? Yes.    Do you have any questions about your discharge instructions: Diet   No. Medications  No. Follow up visit  No.  Do you have questions or concerns about your Care? No.  Actions: * If pain score is 4 or above: No action needed, pain <4.

## 2014-02-17 ENCOUNTER — Encounter: Payer: Self-pay | Admitting: Gastroenterology

## 2014-02-18 ENCOUNTER — Encounter: Payer: BC Managed Care – PPO | Admitting: Gastroenterology

## 2015-01-18 ENCOUNTER — Telehealth: Payer: Self-pay | Admitting: *Deleted

## 2015-01-18 DIAGNOSIS — Z01419 Encounter for gynecological examination (general) (routine) without abnormal findings: Secondary | ICD-10-CM

## 2015-01-18 NOTE — Telephone Encounter (Signed)
Pt will come in on 01/27/15 @ 9:00am for labs "CBC, cmet panel, lipid profile,urinalysis.

## 2015-01-18 NOTE — Telephone Encounter (Signed)
Okay for CBC comprehensive metabolic panel lipid profile urinalysis. In the absence of any other symptoms that should suffice.

## 2015-01-18 NOTE — Telephone Encounter (Signed)
Pt has annual scheduled on 02/17/15 would like labs done prior to annual. Please advise

## 2015-01-27 ENCOUNTER — Other Ambulatory Visit: Payer: Self-pay

## 2015-02-03 ENCOUNTER — Other Ambulatory Visit: Payer: BLUE CROSS/BLUE SHIELD

## 2015-02-03 DIAGNOSIS — Z01419 Encounter for gynecological examination (general) (routine) without abnormal findings: Secondary | ICD-10-CM

## 2015-02-03 LAB — CBC WITH DIFFERENTIAL/PLATELET
Basophils Absolute: 0 10*3/uL (ref 0.0–0.1)
Basophils Relative: 0 % (ref 0–1)
Eosinophils Absolute: 0.1 10*3/uL (ref 0.0–0.7)
Eosinophils Relative: 1 % (ref 0–5)
HCT: 40.8 % (ref 36.0–46.0)
HEMOGLOBIN: 13.4 g/dL (ref 12.0–15.0)
Lymphocytes Relative: 32 % (ref 12–46)
Lymphs Abs: 1.9 10*3/uL (ref 0.7–4.0)
MCH: 30.9 pg (ref 26.0–34.0)
MCHC: 32.8 g/dL (ref 30.0–36.0)
MCV: 94.2 fL (ref 78.0–100.0)
MONO ABS: 0.4 10*3/uL (ref 0.1–1.0)
MONOS PCT: 7 % (ref 3–12)
MPV: 10.8 fL (ref 8.6–12.4)
NEUTROS ABS: 3.6 10*3/uL (ref 1.7–7.7)
NEUTROS PCT: 60 % (ref 43–77)
Platelets: 230 10*3/uL (ref 150–400)
RBC: 4.33 MIL/uL (ref 3.87–5.11)
RDW: 14.5 % (ref 11.5–15.5)
WBC: 6 10*3/uL (ref 4.0–10.5)

## 2015-02-04 LAB — URINALYSIS W MICROSCOPIC + REFLEX CULTURE
BACTERIA UA: NONE SEEN [HPF]
Bilirubin Urine: NEGATIVE
Casts: NONE SEEN [LPF]
Crystals: NONE SEEN [HPF]
Glucose, UA: NEGATIVE
Hgb urine dipstick: NEGATIVE
Ketones, ur: NEGATIVE
Leukocytes, UA: NEGATIVE
NITRITE: NEGATIVE
PROTEIN: NEGATIVE
RBC / HPF: NONE SEEN RBC/HPF (ref <=2)
SPECIFIC GRAVITY, URINE: 1.007 (ref 1.001–1.035)
WBC, UA: NONE SEEN WBC/HPF (ref <=5)
YEAST: NONE SEEN [HPF]
pH: 6.5 (ref 5.0–8.0)

## 2015-02-04 LAB — COMPREHENSIVE METABOLIC PANEL
ALT: 13 U/L (ref 6–29)
AST: 18 U/L (ref 10–35)
Albumin: 4.2 g/dL (ref 3.6–5.1)
Alkaline Phosphatase: 44 U/L (ref 33–115)
BUN: 10 mg/dL (ref 7–25)
CALCIUM: 9.4 mg/dL (ref 8.6–10.2)
CO2: 25 mEq/L (ref 20–31)
Chloride: 102 mEq/L (ref 98–110)
Creat: 0.66 mg/dL (ref 0.50–1.10)
GLUCOSE: 80 mg/dL (ref 65–99)
Potassium: 4.3 mEq/L (ref 3.5–5.3)
Sodium: 137 mEq/L (ref 135–146)
TOTAL PROTEIN: 6.9 g/dL (ref 6.1–8.1)
Total Bilirubin: 0.6 mg/dL (ref 0.2–1.2)

## 2015-02-04 LAB — LIPID PANEL
Cholesterol: 216 mg/dL — ABNORMAL HIGH (ref 125–200)
HDL: 64 mg/dL (ref >=46)
LDL CALC: 126 mg/dL (ref <130)
Total CHOL/HDL Ratio: 3.4 Ratio (ref <=5.0)
Triglycerides: 130 mg/dL (ref <150)
VLDL: 26 mg/dL (ref <30)

## 2015-02-08 ENCOUNTER — Encounter: Payer: Self-pay | Admitting: Gynecology

## 2015-02-12 ENCOUNTER — Other Ambulatory Visit: Payer: Self-pay | Admitting: Gynecology

## 2015-02-12 DIAGNOSIS — E7800 Pure hypercholesterolemia, unspecified: Secondary | ICD-10-CM

## 2015-02-17 ENCOUNTER — Encounter: Payer: Self-pay | Admitting: Gynecology

## 2015-02-17 ENCOUNTER — Ambulatory Visit (INDEPENDENT_AMBULATORY_CARE_PROVIDER_SITE_OTHER): Payer: BLUE CROSS/BLUE SHIELD | Admitting: Gynecology

## 2015-02-17 VITALS — BP 122/80 | Ht 62.0 in | Wt 115.0 lb

## 2015-02-17 DIAGNOSIS — Z01419 Encounter for gynecological examination (general) (routine) without abnormal findings: Secondary | ICD-10-CM

## 2015-02-17 NOTE — Progress Notes (Signed)
Kathy Hodges 1965/09/26 [id number]        49 y.o.  [id number] for annual exam.  Doing well. Several issues noted below.  Past medical history,surgical history, problem list, medications, allergies, family history and social history were all reviewed and documented as reviewed in the EPIC chart.  ROS:  Performed with pertinent positives and negatives included in the history, assessment and plan.   Additional significant findings :  none   Exam: Kim Counsellor Vitals:   02/17/15 1452  BP: 122/80  Height: 5\' 2"  (1.575 m)  Weight: 115 lb (52.164 kg)   General appearance:  Normal affect, orientation and appearance. Skin: Grossly normal HEENT: Without gross lesions.  No cervical or supraclavicular adenopathy. Thyroid normal.  Lungs:  Clear without wheezing, rales or rhonchi Cardiac: RR, without RMG Abdominal:  Soft, nontender, without masses, guarding, rebound, organomegaly or hernia Breasts:  Examined lying and sitting without masses, retractions, discharge or axillary adenopathy. Pelvic:  Ext/BUS/vagina normal  Adnexa  Without masses or tenderness    Anus and perineum  Normal   Rectovaginal  Normal sphincter tone without palpated masses or tenderness.    Assessment/Plan:  49 y.o. [id number] female for annual exam.   1. History of abdominal hysterectomy for menorrhagia 2003. Without menopausal symptoms. Continue to monitor. 2. Pap smear/HPV  Negative 2013. No Pap smear done today. No history of significant abnormal Pap smears previously. Options to stop screening as she is status post hysterectomy for benign indications discussed. Will readdress on an annual basis. 3. History of familial polyposis.  Saw genetic counselor and ultimately was tested for a familial gene mutation called APC and was negative. Was recommended to pursue standard: Screening. Patient did have a colonoscopy 2015 and will repeat this at the gastroenterologist's recommendation. 4. Mammography 01/2015. Continue with  annual mammography. SBE monthly reviewed. 5. Joint pain. Patient notes some generalized joint pain several days each month. Does well otherwise. Family history of rheumatoid arthritis. No overt swelling or persistence of the pain. Reviewed differential to include possible perimenopausal symptoms, early arthritis or other etiologies. Offered screening for rheumatoid and FSH checked. Patient declined at this point prefers just monitor. Will follow up if becomes more consistent or worsens. 6. Health maintenance. Had routine blood work which showed a normal CBC comprehensive metabolic panel urine analysis.  Her cholesterol is 216 and her LDL was 126. HDL was 64. She had seen a cardiologist several years ago due to persistently low level elevations and apparently had transiently tried Lipitor. I recommended that she obtain copies of her labs and next time she sees her primary physician to let them review them. Possible appointment with cardiologist also discussed to see if they would again recommend treatment. Patient agrees to follow up on this. Otherwise patient will see me in one year, sooner as needed.   Anastasio Auerbach MD, 3:23 PM 02/17/2015

## 2015-02-17 NOTE — Patient Instructions (Signed)
You may obtain a copy of any labs that were done today by logging onto MyChart as outlined in the instructions provided with your AVS (after visit summary). The office will not call with normal lab results but certainly if there are any significant abnormalities then we will contact you.   Health Maintenance, Female A healthy lifestyle and preventative care can promote health and wellness.  Maintain regular health, dental, and eye exams.  Eat a healthy diet. Foods like vegetables, fruits, whole grains, low-fat dairy products, and lean protein foods contain the nutrients you need without too many calories. Decrease your intake of foods high in solid fats, added sugars, and salt. Get information about a proper diet from your caregiver, if necessary.  Regular physical exercise is one of the most important things you can do for your health. Most adults should get at least 150 minutes of moderate-intensity exercise (any activity that increases your heart rate and causes you to sweat) each week. In addition, most adults need muscle-strengthening exercises on 2 or more days a week.   Maintain a healthy weight. The body mass index (BMI) is a screening tool to identify possible weight problems. It provides an estimate of body fat based on height and weight. Your caregiver can help determine your BMI, and can help you achieve or maintain a healthy weight. For adults 20 years and older:  A BMI below 18.5 is considered underweight.  A BMI of 18.5 to 24.9 is normal.  A BMI of 25 to 29.9 is considered overweight.  A BMI of 30 and above is considered obese.  Maintain normal blood lipids and cholesterol by exercising and minimizing your intake of saturated fat. Eat a balanced diet with plenty of fruits and vegetables. Blood tests for lipids and cholesterol should begin at age 61 and be repeated every 5 years. If your lipid or cholesterol levels are high, you are over 50, or you are a high risk for heart  disease, you may need your cholesterol levels checked more frequently.Ongoing high lipid and cholesterol levels should be treated with medicines if diet and exercise are not effective.  If you smoke, find out from your caregiver how to quit. If you do not use tobacco, do not start.  Lung cancer screening is recommended for adults aged 33 80 years who are at high risk for developing lung cancer because of a history of smoking. Yearly low-dose computed tomography (CT) is recommended for people who have at least a 30-pack-year history of smoking and are a current smoker or have quit within the past 15 years. A pack year of smoking is smoking an average of 1 pack of cigarettes a day for 1 year (for example: 1 pack a day for 30 years or 2 packs a day for 15 years). Yearly screening should continue until the smoker has stopped smoking for at least 15 years. Yearly screening should also be stopped for people who develop a health problem that would prevent them from having lung cancer treatment.  If you are pregnant, do not drink alcohol. If you are breastfeeding, be very cautious about drinking alcohol. If you are not pregnant and choose to drink alcohol, do not exceed 1 drink per day. One drink is considered to be 12 ounces (355 mL) of beer, 5 ounces (148 mL) of wine, or 1.5 ounces (44 mL) of liquor.  Avoid use of street drugs. Do not share needles with anyone. Ask for help if you need support or instructions about stopping  the use of drugs.  High blood pressure causes heart disease and increases the risk of stroke. Blood pressure should be checked at least every 1 to 2 years. Ongoing high blood pressure should be treated with medicines, if weight loss and exercise are not effective.  If you are 59 to 49 years old, ask your caregiver if you should take aspirin to prevent strokes.  Diabetes screening involves taking a blood sample to check your fasting blood sugar level. This should be done once every 3  years, after age 91, if you are within normal weight and without risk factors for diabetes. Testing should be considered at a younger age or be carried out more frequently if you are overweight and have at least 1 risk factor for diabetes.  Breast cancer screening is essential preventative care for women. You should practice "breast self-awareness." This means understanding the normal appearance and feel of your breasts and may include breast self-examination. Any changes detected, no matter how small, should be reported to a caregiver. Women in their 66s and 30s should have a clinical breast exam (CBE) by a caregiver as part of a regular health exam every 1 to 3 years. After age 101, women should have a CBE every year. Starting at age 100, women should consider having a mammogram (breast X-ray) every year. Women who have a family history of breast cancer should talk to their caregiver about genetic screening. Women at a high risk of breast cancer should talk to their caregiver about having an MRI and a mammogram every year.  Breast cancer gene (BRCA)-related cancer risk assessment is recommended for women who have family members with BRCA-related cancers. BRCA-related cancers include breast, ovarian, tubal, and peritoneal cancers. Having family members with these cancers may be associated with an increased risk for harmful changes (mutations) in the breast cancer genes BRCA1 and BRCA2. Results of the assessment will determine the need for genetic counseling and BRCA1 and BRCA2 testing.  The Pap test is a screening test for cervical cancer. Women should have a Pap test starting at age 57. Between ages 25 and 35, Pap tests should be repeated every 2 years. Beginning at age 37, you should have a Pap test every 3 years as long as the past 3 Pap tests have been normal. If you had a hysterectomy for a problem that was not cancer or a condition that could lead to cancer, then you no longer need Pap tests. If you are  between ages 50 and 76, and you have had normal Pap tests going back 10 years, you no longer need Pap tests. If you have had past treatment for cervical cancer or a condition that could lead to cancer, you need Pap tests and screening for cancer for at least 20 years after your treatment. If Pap tests have been discontinued, risk factors (such as a new sexual partner) need to be reassessed to determine if screening should be resumed. Some women have medical problems that increase the chance of getting cervical cancer. In these cases, your caregiver may recommend more frequent screening and Pap tests.  The human papillomavirus (HPV) test is an additional test that may be used for cervical cancer screening. The HPV test looks for the virus that can cause the cell changes on the cervix. The cells collected during the Pap test can be tested for HPV. The HPV test could be used to screen women aged 44 years and older, and should be used in women of any age  who have unclear Pap test results. After the age of 55, women should have HPV testing at the same frequency as a Pap test.  Colorectal cancer can be detected and often prevented. Most routine colorectal cancer screening begins at the age of 44 and continues through age 20. However, your caregiver may recommend screening at an earlier age if you have risk factors for colon cancer. On a yearly basis, your caregiver may provide home test kits to check for hidden blood in the stool. Use of a small camera at the end of a tube, to directly examine the colon (sigmoidoscopy or colonoscopy), can detect the earliest forms of colorectal cancer. Talk to your caregiver about this at age 86, when routine screening begins. Direct examination of the colon should be repeated every 5 to 10 years through age 13, unless early forms of pre-cancerous polyps or small growths are found.  Hepatitis C blood testing is recommended for all people born from [birth year] through 1965 and any  individual with known risks for hepatitis C.  Practice safe sex. Use condoms and avoid high-risk sexual practices to reduce the spread of sexually transmitted infections (STIs). Sexually active women aged 36 and younger should be checked for Chlamydia, which is a common sexually transmitted infection. Older women with new or multiple partners should also be tested for Chlamydia. Testing for other STIs is recommended if you are sexually active and at increased risk.  Osteoporosis is a disease in which the bones lose minerals and strength with aging. This can result in serious bone fractures. The risk of osteoporosis can be identified using a bone density scan. Women ages 20 and over and women at risk for fractures or osteoporosis should discuss screening with their caregivers. Ask your caregiver whether you should be taking a calcium supplement or vitamin D to reduce the rate of osteoporosis.  Menopause can be associated with physical symptoms and risks. Hormone replacement therapy is available to decrease symptoms and risks. You should talk to your caregiver about whether hormone replacement therapy is right for you.  Use sunscreen. Apply sunscreen liberally and repeatedly throughout the day. You should seek shade when your shadow is shorter than you. Protect yourself by wearing long sleeves, pants, a wide-brimmed hat, and sunglasses year round, whenever you are outdoors.  Notify your caregiver of new moles or changes in moles, especially if there is a change in shape or color. Also notify your caregiver if a mole is larger than the size of a pencil eraser.  Stay current with your immunizations. Document Released: 01/09/2011 Document Revised: 10/21/2012 Document Reviewed: 01/09/2011 [facility] Patient Information 2014 [employer].

## 2015-04-18 IMAGING — CT CT ABD-PELV W/ CM
2 of 5 series · 17 of 46 positions shown, 19 images · IV contrast (Omnipaque 300)
Comparison: None.

CLINICAL DATA: Right lower quadrant pain for 6-8 months, chronic
diarrhea

EXAM:
CT ABDOMEN AND PELVIS WITH CONTRAST
TECHNIQUE: Multidetector CT imaging of the abdomen and pelvis was performed
using the standard protocol following bolus administration of
intravenous contrast.
CONTRAST:  80mL OMNIPAQUE IOHEXOL 300 MG/ML  SOLN

[Series 2: abd/ pel 5mm · axial · 0.61mm/px · z∈[-458,-88]mm · 14 of 82 slices shown, 16 images]
[im 4/82  soft-tissue]
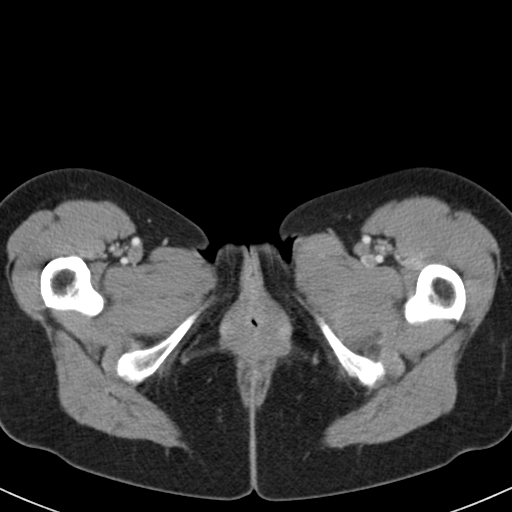
[im 4/82  bone]
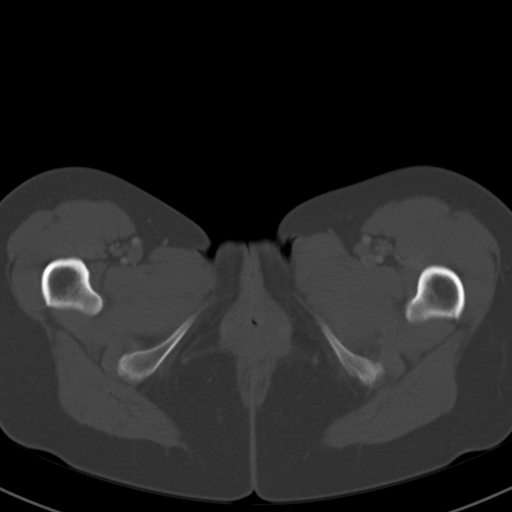
[im 12/82  soft-tissue]
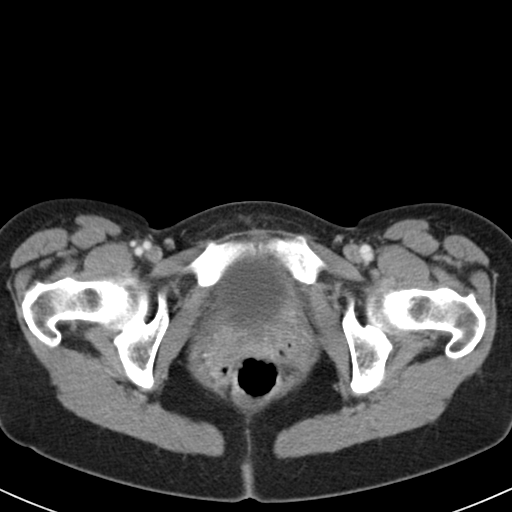
[im 16/82  soft-tissue]
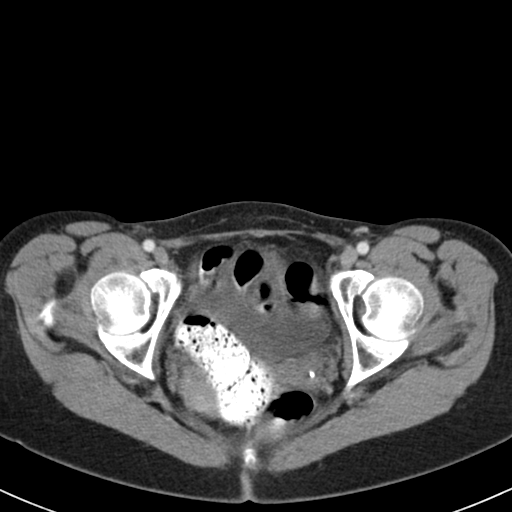
[im 24/82  soft-tissue]
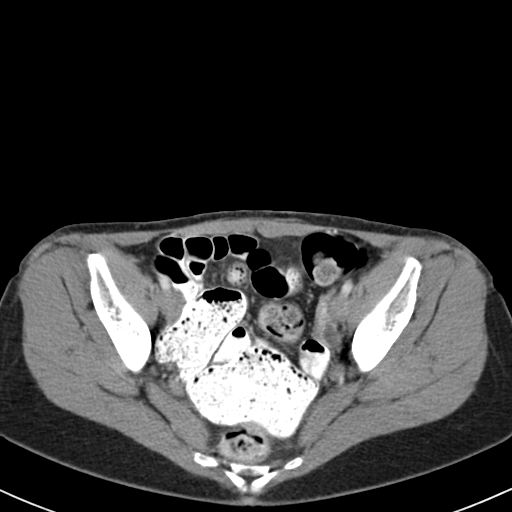
[im 28/82  soft-tissue]
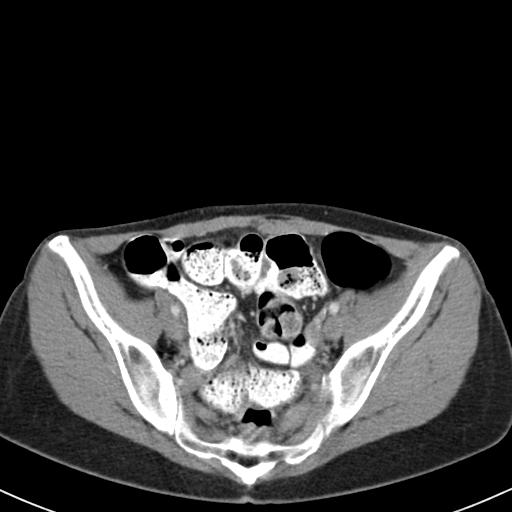
[im 31/82  soft-tissue]
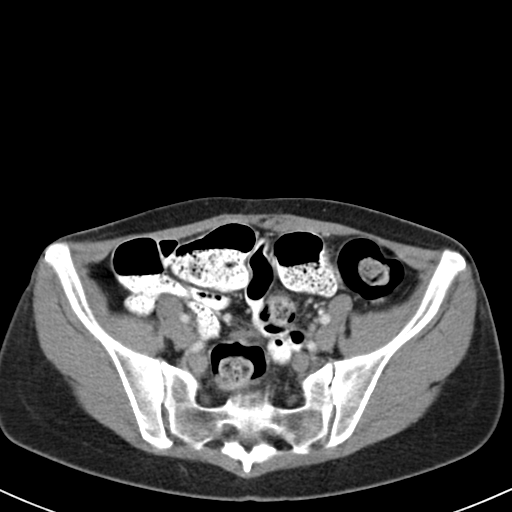
[im 39/82  soft-tissue]
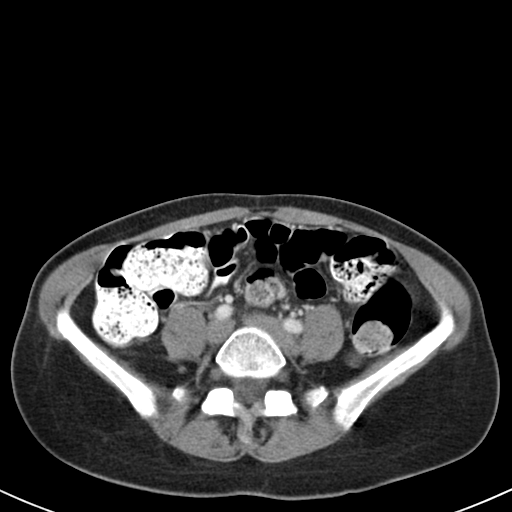
[im 43/82  soft-tissue]
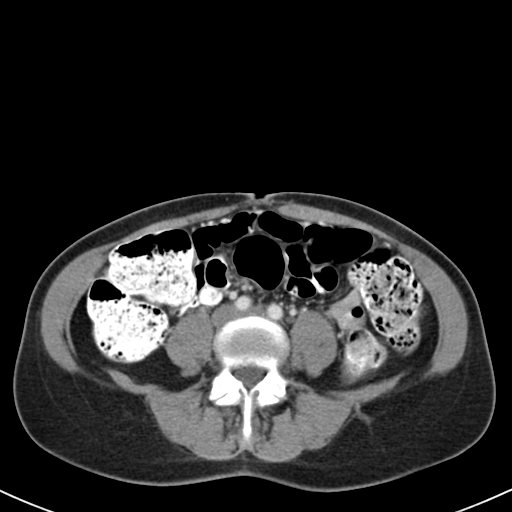
[im 51/82  soft-tissue]
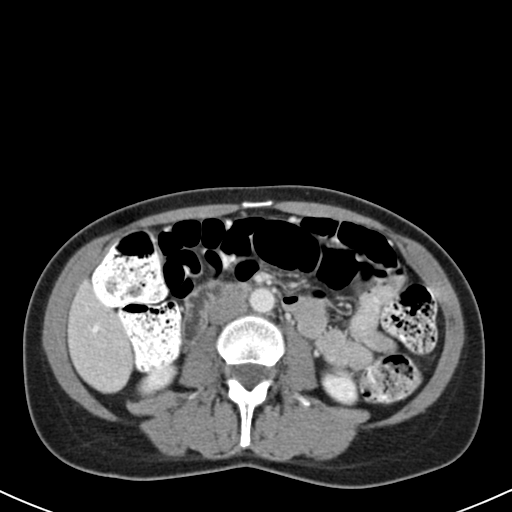
[im 51/82  bone]
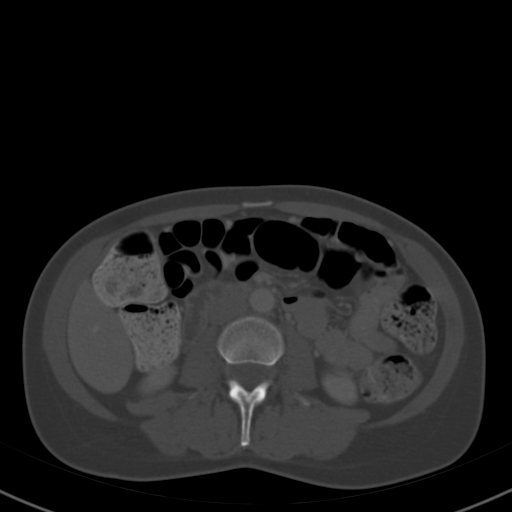
[im 55/82  soft-tissue]
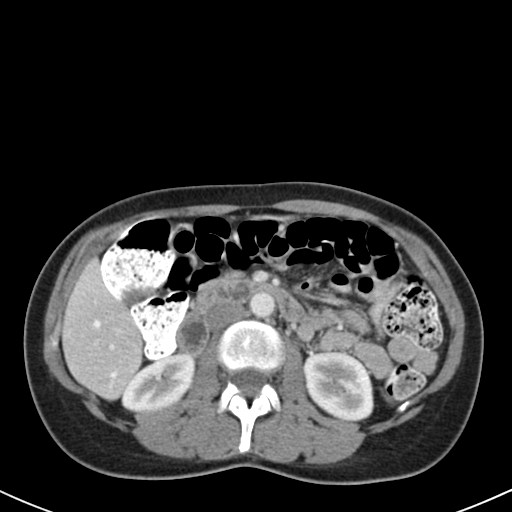
[im 62/82  soft-tissue]
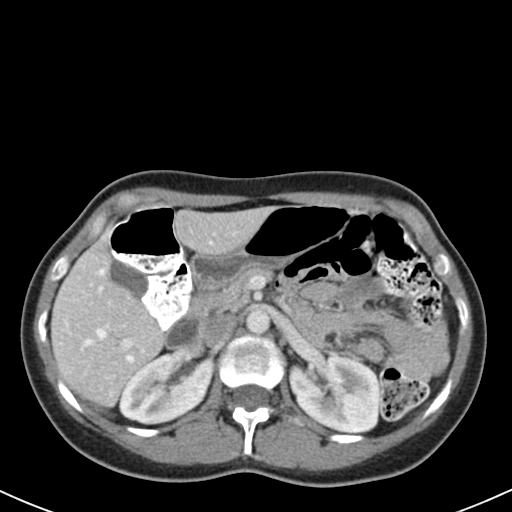
[im 66/82  soft-tissue]
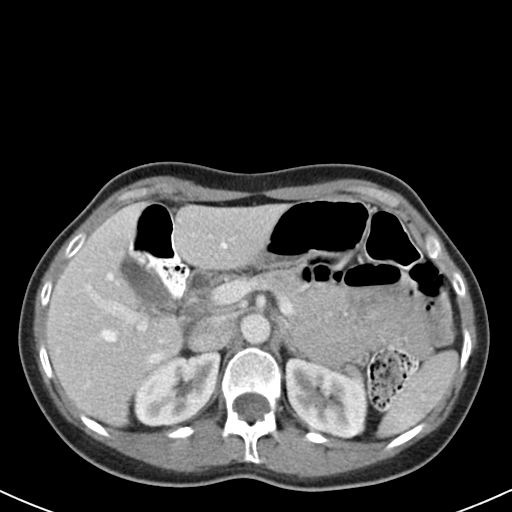
[im 70/82  soft-tissue]
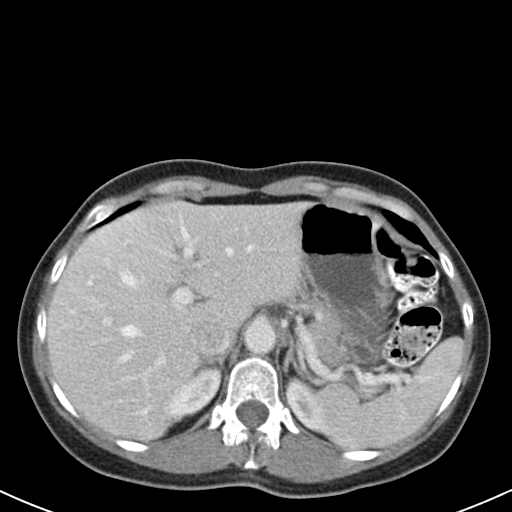
[im 78/82  soft-tissue]
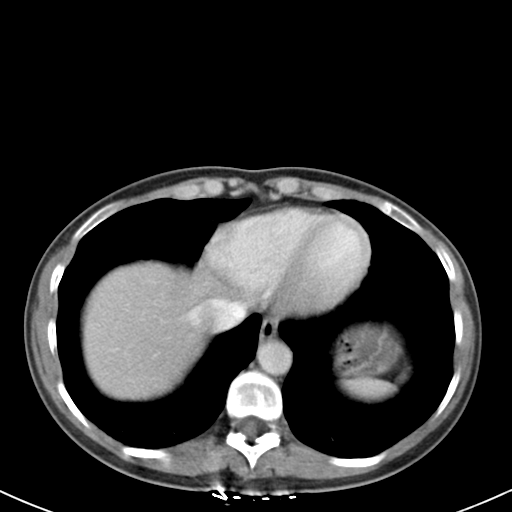

[Series 602: cor · coronal · 0.83mm/px · 3 of 97 slices shown]
[im 33/97  soft-tissue]
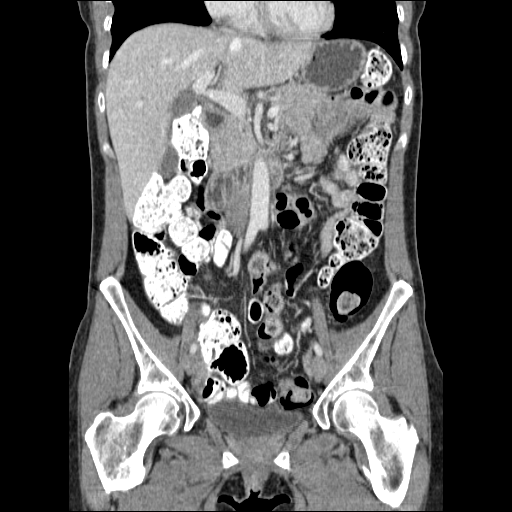
[im 43/97  soft-tissue]
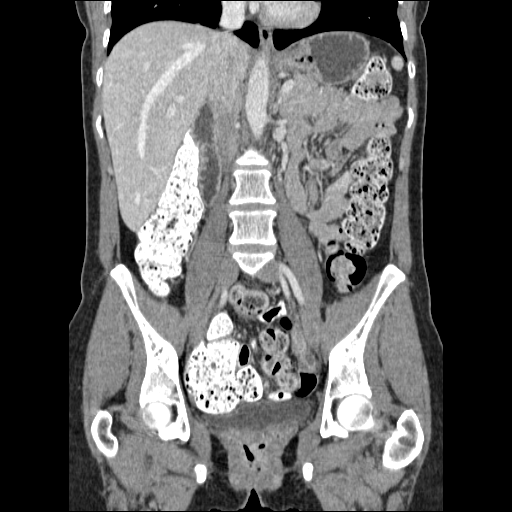
[im 54/97  soft-tissue]
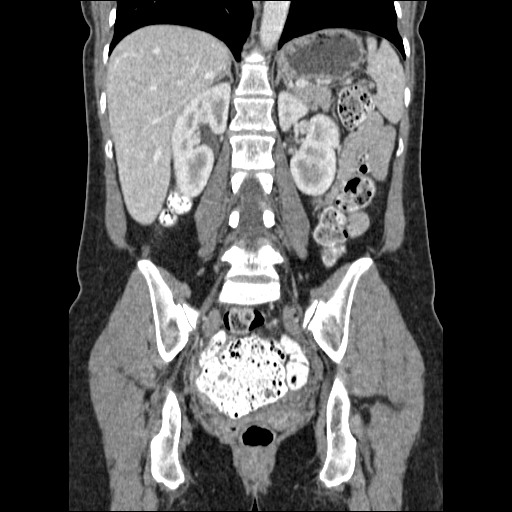

[17 of 46 positions shown; findings below may reference images not displayed]

FINDINGS: The lung bases are clear. The liver enhances with no focal
abnormality and no ductal dilatation is seen. No calcified
gallstones are noted. The pancreas is normal in size in the
pancreatic duct is not dilated. The adrenal glands and spleen are
unremarkable. The stomach is moderately fluid distended. The kidneys
enhance with no calculus or mass and on delayed images, the
pelvocaliceal systems are unremarkable. Probable small cysts are
noted with a low-attenuation structure in the upper pole of the left
kidney medially and in the lower pole of the right kidney. The
abdominal aorta is normal in caliber. No adenopathy is seen.

The urinary bladder is not well distended but no abnormality is
noted. No pelvic mass or fluid is seen. There is a moderate amount
of feces throughout the colon. The terminal ileum is unremarkable.
The cecum is located low in the pelvis posteriorly. The appendix is
not visualized. The lumbar vertebrae are in normal alignment. A
probable benign bone island is noted in the left iliac wing
medially.
IMPRESSION: 1. No explanation for the patient's right lower quadrant pain is
seen other than a moderate amount of feces throughout the colon.
2. The appendix is not visualized. The terminal ileum appears
normal.

## 2016-01-26 DIAGNOSIS — Z1231 Encounter for screening mammogram for malignant neoplasm of breast: Secondary | ICD-10-CM | POA: Diagnosis not present

## 2016-02-04 ENCOUNTER — Encounter: Payer: Self-pay | Admitting: Gynecology

## 2016-02-17 ENCOUNTER — Telehealth: Payer: Self-pay | Admitting: *Deleted

## 2016-02-17 ENCOUNTER — Other Ambulatory Visit: Payer: BLUE CROSS/BLUE SHIELD

## 2016-02-17 DIAGNOSIS — Z13220 Encounter for screening for lipoid disorders: Secondary | ICD-10-CM

## 2016-02-17 DIAGNOSIS — Z1329 Encounter for screening for other suspected endocrine disorder: Secondary | ICD-10-CM

## 2016-02-17 DIAGNOSIS — Z1321 Encounter for screening for nutritional disorder: Secondary | ICD-10-CM | POA: Diagnosis not present

## 2016-02-17 DIAGNOSIS — Z01419 Encounter for gynecological examination (general) (routine) without abnormal findings: Secondary | ICD-10-CM

## 2016-02-17 LAB — COMPREHENSIVE METABOLIC PANEL
ALBUMIN: 4.3 g/dL (ref 3.6–5.1)
ALK PHOS: 49 U/L (ref 33–130)
ALT: 12 U/L (ref 6–29)
AST: 17 U/L (ref 10–35)
BILIRUBIN TOTAL: 0.4 mg/dL (ref 0.2–1.2)
BUN: 11 mg/dL (ref 7–25)
CALCIUM: 9.6 mg/dL (ref 8.6–10.4)
CO2: 25 mmol/L (ref 20–31)
Chloride: 104 mmol/L (ref 98–110)
Creat: 0.72 mg/dL (ref 0.50–1.05)
GLUCOSE: 79 mg/dL (ref 65–99)
POTASSIUM: 3.9 mmol/L (ref 3.5–5.3)
Sodium: 137 mmol/L (ref 135–146)
Total Protein: 7 g/dL (ref 6.1–8.1)

## 2016-02-17 LAB — CBC WITH DIFFERENTIAL/PLATELET
BASOS PCT: 0 %
Basophils Absolute: 0 cells/uL (ref 0–200)
EOS ABS: 70 {cells}/uL (ref 15–500)
Eosinophils Relative: 1 %
HEMATOCRIT: 41.6 % (ref 35.0–45.0)
HEMOGLOBIN: 13.5 g/dL (ref 11.7–15.5)
LYMPHS ABS: 1890 {cells}/uL (ref 850–3900)
LYMPHS PCT: 27 %
MCH: 31 pg (ref 27.0–33.0)
MCHC: 32.5 g/dL (ref 32.0–36.0)
MCV: 95.6 fL (ref 80.0–100.0)
MONO ABS: 420 {cells}/uL (ref 200–950)
MPV: 12.1 fL (ref 7.5–12.5)
Monocytes Relative: 6 %
NEUTROS PCT: 66 %
Neutro Abs: 4620 cells/uL (ref 1500–7800)
Platelets: 214 10*3/uL (ref 140–400)
RBC: 4.35 MIL/uL (ref 3.80–5.10)
RDW: 14.1 % (ref 11.0–15.0)
WBC: 7 10*3/uL (ref 3.8–10.8)

## 2016-02-17 LAB — LIPID PANEL
CHOL/HDL RATIO: 3.1 ratio (ref <=5.0)
Cholesterol: 195 mg/dL (ref 125–200)
HDL: 62 mg/dL (ref >=46)
LDL Cholesterol: 106 mg/dL (ref <130)
TRIGLYCERIDES: 136 mg/dL (ref <150)
VLDL: 27 mg/dL (ref <30)

## 2016-02-17 LAB — TSH: TSH: 0.85 mIU/L

## 2016-02-17 NOTE — Telephone Encounter (Signed)
Pt has annual schedule on 03/06/16 wants labs drawn prior to annual exam. Please advise

## 2016-02-17 NOTE — Telephone Encounter (Signed)
Orders placed.

## 2016-02-17 NOTE — Telephone Encounter (Signed)
CBC, comprehensive metabolic panel, lipid profile, vitamin D, TSH, urinalysis

## 2016-02-18 LAB — URINALYSIS W MICROSCOPIC + REFLEX CULTURE
BILIRUBIN URINE: NEGATIVE
Bacteria, UA: NONE SEEN [HPF]
CRYSTALS: NONE SEEN [HPF]
Casts: NONE SEEN [LPF]
GLUCOSE, UA: NEGATIVE
Hgb urine dipstick: NEGATIVE
Ketones, ur: NEGATIVE
LEUKOCYTES UA: NEGATIVE
Nitrite: NEGATIVE
Protein, ur: NEGATIVE
SPECIFIC GRAVITY, URINE: 1.02 (ref 1.001–1.035)
WBC UA: NONE SEEN WBC/HPF (ref <=5)
YEAST: NONE SEEN [HPF]
pH: 5.5 (ref 5.0–8.0)

## 2016-02-18 LAB — VITAMIN D 25 HYDROXY (VIT D DEFICIENCY, FRACTURES): VIT D 25 HYDROXY: 39 ng/mL (ref 30–100)

## 2016-02-19 LAB — URINE CULTURE: Organism ID, Bacteria: 10000

## 2016-02-21 ENCOUNTER — Encounter: Payer: BLUE CROSS/BLUE SHIELD | Admitting: Gynecology

## 2016-03-06 ENCOUNTER — Encounter: Payer: Self-pay | Admitting: Gynecology

## 2016-03-06 ENCOUNTER — Ambulatory Visit (INDEPENDENT_AMBULATORY_CARE_PROVIDER_SITE_OTHER): Payer: BLUE CROSS/BLUE SHIELD | Admitting: Gynecology

## 2016-03-06 VITALS — BP 118/74 | Ht 62.0 in | Wt 118.0 lb

## 2016-03-06 DIAGNOSIS — Z01419 Encounter for gynecological examination (general) (routine) without abnormal findings: Secondary | ICD-10-CM | POA: Diagnosis not present

## 2016-03-06 DIAGNOSIS — N952 Postmenopausal atrophic vaginitis: Secondary | ICD-10-CM

## 2016-03-06 NOTE — Progress Notes (Signed)
    Kathy Hodges 1965/10/30 [id number]        50 y.o.  [id number]  for annual exam.  Doing well without complaints  Past medical history,surgical history, problem list, medications, allergies, family history and social history were all reviewed and documented as reviewed in the EPIC chart.  ROS:  Performed with pertinent positives and negatives included in the history, assessment and plan.   Additional significant findings :  None   Exam: Kathy Hodges assistant Vitals:   03/06/16 0956  BP: 118/74  Weight: 118 lb (53.5 kg)  Height: 5\' 2"  (1.575 m)   Body mass index is 21.58 kg/m.  General appearance:  Normal affect, orientation and appearance. Skin: Grossly normal HEENT: Without gross lesions.  No cervical or supraclavicular adenopathy. Thyroid normal.  Lungs:  Clear without wheezing, rales or rhonchi Cardiac: RR, without RMG Abdominal:  Soft, nontender, without masses, guarding, rebound, organomegaly or hernia Breasts:  Examined lying and sitting without masses, retractions, discharge or axillary adenopathy. Pelvic:  Ext/BUS/Vagina with mild atrophic changes  Adnexa without masses or tenderness    Anus and perineum normal   Rectovaginal normal sphincter tone without palpated masses or tenderness.    Assessment/Plan:  50 y.o. [id number] female for annual exam.  1. Perimenopausal. Status post TAH for menorrhagia 2003. No significant menopausal symptoms. Continue monitor and report any issues. 2. Pap smear/HPV 2013. No Pap smear done today. No history of significant abnormal Pap smears. Options to stop screening versus less frequent screening intervals reviewed. Will readdress on annual basis. 3. Colonoscopy 2015. Recommended repeat interval per patient is 5 years. She'll follow up with her gastroenterologist reference to this. 4. Mammography 01/2016. Continue with annual exam when due. SBE monthly reviewed. 5. DEXA never. Will plan further into the menopause. Increase calcium vitamin D.   Recent vitamin D 39. 6. Health maintenance. Had baseline labs recently done.  LDL marginally elevated at 106 but HDL 62. Cholesterol normal 195. Remainder of labs normal.  Follow up in one year, sooner as needed.   Anastasio Auerbach MD, 10:16 AM 03/06/2016

## 2016-03-06 NOTE — Patient Instructions (Signed)

## 2016-06-28 DIAGNOSIS — Z23 Encounter for immunization: Secondary | ICD-10-CM | POA: Diagnosis not present

## 2016-10-26 ENCOUNTER — Encounter: Payer: Self-pay | Admitting: Sports Medicine

## 2016-10-26 ENCOUNTER — Ambulatory Visit (INDEPENDENT_AMBULATORY_CARE_PROVIDER_SITE_OTHER): Payer: BLUE CROSS/BLUE SHIELD

## 2016-10-26 ENCOUNTER — Ambulatory Visit (INDEPENDENT_AMBULATORY_CARE_PROVIDER_SITE_OTHER): Payer: BLUE CROSS/BLUE SHIELD | Admitting: Sports Medicine

## 2016-10-26 DIAGNOSIS — M7641 Tibial collateral bursitis [Pellegrini-Stieda], right leg: Secondary | ICD-10-CM

## 2016-10-26 DIAGNOSIS — M1712 Unilateral primary osteoarthritis, left knee: Secondary | ICD-10-CM | POA: Diagnosis not present

## 2016-10-26 DIAGNOSIS — M79641 Pain in right hand: Secondary | ICD-10-CM

## 2016-10-26 DIAGNOSIS — M25562 Pain in left knee: Secondary | ICD-10-CM | POA: Diagnosis not present

## 2016-10-26 DIAGNOSIS — M1711 Unilateral primary osteoarthritis, right knee: Secondary | ICD-10-CM | POA: Diagnosis not present

## 2016-10-26 DIAGNOSIS — M79642 Pain in left hand: Secondary | ICD-10-CM | POA: Diagnosis not present

## 2016-10-26 DIAGNOSIS — M130 Polyarthritis, unspecified: Secondary | ICD-10-CM | POA: Diagnosis not present

## 2016-10-26 DIAGNOSIS — M2550 Pain in unspecified joint: Secondary | ICD-10-CM | POA: Diagnosis not present

## 2016-10-26 DIAGNOSIS — M25542 Pain in joints of left hand: Secondary | ICD-10-CM | POA: Diagnosis not present

## 2016-10-26 LAB — CBC WITH DIFFERENTIAL/PLATELET
Basophils Absolute: 0 cells/uL (ref 0–200)
Basophils Relative: 0 %
Eosinophils Absolute: 60 cells/uL (ref 15–500)
Eosinophils Relative: 1 %
HCT: 41 % (ref 35.0–45.0)
Hemoglobin: 13.4 g/dL (ref 11.7–15.5)
Lymphocytes Relative: 28 %
Lymphs Abs: 1680 {cells}/uL (ref 850–3900)
MCH: 30.7 pg (ref 27.0–33.0)
MCHC: 32.7 g/dL (ref 32.0–36.0)
MCV: 94 fL (ref 80.0–100.0)
MPV: 12 fL (ref 7.5–12.5)
Monocytes Absolute: 420 {cells}/uL (ref 200–950)
Monocytes Relative: 7 %
Neutro Abs: 3840 {cells}/uL (ref 1500–7800)
Neutrophils Relative %: 64 %
Platelets: 244 10*3/uL (ref 140–400)
RBC: 4.36 MIL/uL (ref 3.80–5.10)
RDW: 13.6 % (ref 11.0–15.0)
WBC: 6 K/uL (ref 3.8–10.8)

## 2016-10-26 LAB — COMPREHENSIVE METABOLIC PANEL
AST: 18 U/L (ref 10–35)
Albumin: 4.3 g/dL (ref 3.6–5.1)
Alkaline Phosphatase: 49 U/L (ref 33–130)
CO2: 26 mmol/L (ref 20–31)
Calcium: 9.9 mg/dL (ref 8.6–10.4)
Chloride: 102 mmol/L (ref 98–110)
Total Bilirubin: 0.4 mg/dL (ref 0.2–1.2)

## 2016-10-26 LAB — COMPREHENSIVE METABOLIC PANEL WITH GFR
ALT: 13 U/L (ref 6–29)
BUN: 12 mg/dL (ref 7–25)
Creat: 0.81 mg/dL (ref 0.50–1.05)
Glucose, Bld: 77 mg/dL (ref 65–99)
Potassium: 4.1 mmol/L (ref 3.5–5.3)
Sodium: 139 mmol/L (ref 135–146)
Total Protein: 7.3 g/dL (ref 6.1–8.1)

## 2016-10-26 LAB — CK: Total CK: 70 U/L (ref 7–177)

## 2016-10-26 LAB — URIC ACID: Uric Acid, Serum: 4.1 mg/dL (ref 2.5–7.0)

## 2016-10-26 MED ORDER — MELOXICAM 15 MG PO TABS: ORAL_TABLET | ORAL | 3 refills | Status: DC

## 2016-10-26 NOTE — Assessment & Plan Note (Signed)
Hands and wrists, knees, hips. Occasional pain in the jaw and teeth. Hip girdle pain. Differential is broad including widespread osteoarthritis, polymyalgia rheumatica, rheumatoid arthritis/connective tissue disorders. Checking a full rheumatoid panel, adding meloxicam. Xrays. Return to see me in 2 weeks.

## 2016-10-26 NOTE — Progress Notes (Signed)
   Subjective:    I'm seeing this patient as a consultation for:  Dr. Marcial Pacas  CC: Multiple joint pains  HPI:  For sometime now this pleasant 51 year old female with a family history of rheumatoid arthritis has had widespread aches and pains in her hands, wrists, knees, hips. She does note gelling without much morning stiffness. No swelling. No trauma. No constitutional symptoms.  She takes Aleve which provides good relief of her symptoms. She also has achiness in her teeth and her jaws particularly when chewing. She has seen a couple of dentists and had a couple of root canals without any improvement.  Past medical history:  Negative.  See flowsheet/record as well for more information.  Surgical history: Negative.  See flowsheet/record as well for more information.  Family history: Negative.  See flowsheet/record as well for more information.  Social history: Negative.  See flowsheet/record as well for more information.  Allergies, and medications have been entered into the medical record, reviewed, and no changes needed.   Review of Systems: No headache, visual changes, nausea, vomiting, diarrhea, constipation, dizziness, abdominal pain, skin rash, fevers, chills, night sweats, weight loss, swollen lymph nodes, body aches, joint swelling, muscle aches, chest pain, shortness of breath, mood changes, visual or auditory hallucinations.   Objective:   General: Well Developed, well nourished, and in no acute distress.  Neuro/Psych: Alert and oriented x3, extra-ocular muscles intact, able to move all 4 extremities, sensation grossly intact. Skin: Warm and dry, no rashes noted.  Respiratory: Not using accessory muscles, speaking in full sentences, trachea midline.  Cardiovascular: Pulses palpable, no extremity edema. Abdomen: Does not appear distended. Musculoskeletal: Hands, wrists, knees, hips examined, full range of motion, no areas of tenderness palpation, no palpable synovitis. She does  have some pain in the buttock with internal rotation of both hips. No Heberden's or Bouchard's nodes, no ulnar deviation of the fingers.  Impression and Recommendations:   This case required medical decision making of moderate complexity.  Polyarthralgia Hands and wrists, knees, hips. Occasional pain in the jaw and teeth. Hip girdle pain. Differential is broad including widespread osteoarthritis, polymyalgia rheumatica, rheumatoid arthritis/connective tissue disorders. Checking a full rheumatoid panel, adding meloxicam. Xrays. Return to see me in 2 weeks.

## 2016-10-27 LAB — ANA: Anti Nuclear Antibody(ANA): NEGATIVE

## 2016-10-27 LAB — SEDIMENTATION RATE: Sed Rate: 4 mm/h (ref 0–20)

## 2016-10-27 LAB — RHEUMATOID FACTOR: Rheumatoid fact SerPl-aCnc: <14 [IU]/mL (ref <14)

## 2016-10-30 LAB — CYCLIC CITRUL PEPTIDE ANTIBODY, IGG: Cyclic Citrullin Peptide Ab: <16 Units

## 2016-11-07 ENCOUNTER — Encounter: Payer: Self-pay | Admitting: Sports Medicine

## 2016-11-07 ENCOUNTER — Ambulatory Visit (INDEPENDENT_AMBULATORY_CARE_PROVIDER_SITE_OTHER): Payer: BLUE CROSS/BLUE SHIELD | Admitting: Sports Medicine

## 2016-11-07 DIAGNOSIS — M2550 Pain in unspecified joint: Secondary | ICD-10-CM | POA: Diagnosis not present

## 2016-11-07 MED ORDER — COSAMIN ASU ADVANCED FORMULA PO CAPS: 2.0000 | ORAL_CAPSULE | Freq: Two times a day (BID) | ORAL | 11 refills | Status: DC

## 2016-11-07 MED ORDER — MELOXICAM 15 MG PO TABS: 15.0000 mg | ORAL_TABLET | ORAL | 3 refills | Status: DC

## 2016-11-07 MED ORDER — ACETAMINOPHEN ER 650 MG PO TBCR: 650.0000 mg | EXTENDED_RELEASE_TABLET | ORAL | 3 refills | Status: DC

## 2016-11-07 NOTE — Assessment & Plan Note (Addendum)
Rheumatoid workup was negative, x-rays were negative for the most part with the exception of mild knee osteoarthritis, which I do think is widespread but too early to see on x-rays and other joints. Continue meloxicam every other day and can also alternate with arthritis strength Tylenol, keep active, and keep weight under control. She can also try Cosamin over-the-counter. She will establish with Sherlie Ban PA-C.

## 2016-11-07 NOTE — Progress Notes (Signed)
  Subjective:    CC: Follow-up   HPI: Kathy Hodges came in with widespread polyarthralgias, rheumatoid workup was negative, x-rays showed mild degenerative changes in the knees, she is 80-90% better with meloxicam.  Past medical history:  Negative.  See flowsheet/record as well for more information.  Surgical history: Negative.  See flowsheet/record as well for more information.  Family history: Negative.  See flowsheet/record as well for more information.  Social history: Negative.  See flowsheet/record as well for more information.  Allergies, and medications have been entered into the medical record, reviewed, and no changes needed.   Review of Systems: No fevers, chills, night sweats, weight loss, chest pain, or shortness of breath.   Objective:    General: Well Developed, well nourished, and in no acute distress.  Neuro: Alert and oriented x3, extra-ocular muscles intact, sensation grossly intact.  HEENT: Normocephalic, atraumatic, pupils equal round reactive to light, neck supple, no masses, no lymphadenopathy, thyroid nonpalpable.  Skin: Warm and dry, no rashes. Cardiac: Regular rate and rhythm, no murmurs rubs or gallops, no lower extremity edema.  Respiratory: Clear to auscultation bilaterally. Not using accessory muscles, speaking in full sentences.  Impression and Recommendations:    Polyarthralgia Rheumatoid workup was negative, x-rays were negative for the most part with the exception of mild knee osteoarthritis, which I do think is widespread but too early to see on x-rays and other joints. Continue meloxicam every other day and can also alternate with arthritis strength Tylenol, keep active, and keep weight under control. She can also try Cosamin over-the-counter. She will establish with Sherlie Ban PA-C.  I spent 25 minutes with this patient, greater than 50% was face-to-face time counseling regarding the above diagnoses

## 2017-03-01 ENCOUNTER — Other Ambulatory Visit: Payer: Self-pay | Admitting: Sports Medicine

## 2017-03-01 DIAGNOSIS — M2550 Pain in unspecified joint: Secondary | ICD-10-CM

## 2017-04-05 ENCOUNTER — Other Ambulatory Visit: Payer: Self-pay | Admitting: Sports Medicine

## 2017-04-05 DIAGNOSIS — M2550 Pain in unspecified joint: Secondary | ICD-10-CM

## 2017-05-09 ENCOUNTER — Other Ambulatory Visit: Payer: Self-pay | Admitting: Sports Medicine

## 2017-05-09 DIAGNOSIS — M2550 Pain in unspecified joint: Secondary | ICD-10-CM

## 2017-05-30 DIAGNOSIS — Z23 Encounter for immunization: Secondary | ICD-10-CM | POA: Diagnosis not present

## 2017-06-05 ENCOUNTER — Other Ambulatory Visit: Payer: Self-pay | Admitting: Sports Medicine

## 2017-06-05 DIAGNOSIS — M2550 Pain in unspecified joint: Secondary | ICD-10-CM

## 2017-06-21 ENCOUNTER — Ambulatory Visit (INDEPENDENT_AMBULATORY_CARE_PROVIDER_SITE_OTHER): Payer: BLUE CROSS/BLUE SHIELD

## 2017-06-21 ENCOUNTER — Encounter: Payer: Self-pay | Admitting: Physician Assistant

## 2017-06-21 ENCOUNTER — Ambulatory Visit: Payer: BLUE CROSS/BLUE SHIELD | Admitting: Physician Assistant

## 2017-06-21 VITALS — BP 118/76 | HR 57 | Ht 62.0 in | Wt 122.0 lb

## 2017-06-21 DIAGNOSIS — M25511 Pain in right shoulder: Secondary | ICD-10-CM

## 2017-06-21 DIAGNOSIS — M25512 Pain in left shoulder: Secondary | ICD-10-CM | POA: Diagnosis not present

## 2017-06-21 DIAGNOSIS — Z8371 Family history of colonic polyps: Secondary | ICD-10-CM

## 2017-06-21 DIAGNOSIS — Z130 Encounter for screening for diseases of the blood and blood-forming organs and certain disorders involving the immune mechanism: Secondary | ICD-10-CM

## 2017-06-21 DIAGNOSIS — M7550 Bursitis of unspecified shoulder: Secondary | ICD-10-CM | POA: Insufficient documentation

## 2017-06-21 DIAGNOSIS — Z8372 Family history of familial adenomatous polyposis: Secondary | ICD-10-CM

## 2017-06-21 DIAGNOSIS — Z7689 Persons encountering health services in other specified circumstances: Secondary | ICD-10-CM

## 2017-06-21 DIAGNOSIS — Z1231 Encounter for screening mammogram for malignant neoplasm of breast: Secondary | ICD-10-CM | POA: Diagnosis not present

## 2017-06-21 DIAGNOSIS — Z8249 Family history of ischemic heart disease and other diseases of the circulatory system: Secondary | ICD-10-CM | POA: Diagnosis not present

## 2017-06-21 DIAGNOSIS — Z131 Encounter for screening for diabetes mellitus: Secondary | ICD-10-CM

## 2017-06-21 DIAGNOSIS — M2550 Pain in unspecified joint: Secondary | ICD-10-CM

## 2017-06-21 DIAGNOSIS — Z13220 Encounter for screening for lipoid disorders: Secondary | ICD-10-CM

## 2017-06-21 DIAGNOSIS — Z90710 Acquired absence of both cervix and uterus: Secondary | ICD-10-CM

## 2017-06-21 MED ORDER — ACETAMINOPHEN ER 650 MG PO TBCR: 1300.0000 mg | EXTENDED_RELEASE_TABLET | Freq: Three times a day (TID) | ORAL | 3 refills | Status: DC | PRN

## 2017-06-21 MED ORDER — COSAMIN ASU ADVANCED FORMULA PO CAPS: 2.0000 | ORAL_CAPSULE | Freq: Two times a day (BID) | ORAL | 11 refills | Status: DC

## 2017-06-21 MED ORDER — DICLOFENAC SODIUM 75 MG PO TBEC: 75.0000 mg | DELAYED_RELEASE_TABLET | Freq: Two times a day (BID) | ORAL | 1 refills | Status: DC

## 2017-06-21 NOTE — Progress Notes (Signed)
Hi Kathy Hodges,  Nice meeting you today! Your shoulder x-ray show some mild arthritis where your clavicle meets your left shoulder. There was otherwise no abnormality. Your right shoulder x-ray was normal. Treatment plan does not change. Still recommend following up with Dr. Darene Lamer in 2 weeks.  Best, Evlyn Clines

## 2017-06-21 NOTE — Progress Notes (Signed)
HPI:                                                                Kathy Hodges is a 51 y.o. female who presents to [facility]: [facility] today to establish care  Current concerns include: arthritis  Patient was last seen by Sports Medicine 7 months ago for polyarthralgia with negative rheumatologic work-up. States her pain was well controlled over the summer months, but has been gradually worsening for the last 2-3 months. Pain is persistent in shoulders and intermittent in other joints. She has had to cut back on her activities, including exercise. Feels weak in her upper extremities. She has had to increase her Meloxicam from every other day to daily.   Past Medical History:  Diagnosis Date  . Digestive problems   . Hyperlipidemia   . Joint pain   . Osteoarthritis    Past Surgical History:  Procedure Laterality Date  . CESAREAN SECTION  2000, 2002   x2  . PARTIAL HYSTERECTOMY  2003   with ab muscle repair and hernia repair and bladder tacting for menorrahgia   Social History   Tobacco Use  . Smoking status: Never Smoker  . Smokeless tobacco: Never Used  Substance Use Topics  . Alcohol use: Yes    Alcohol/week: 1.8 oz    Types: 3 Cans of beer per week   family history includes Colon cancer (age of onset: 32) in her paternal uncle; Colon polyps in her father; Heart disease in her father; Heart disease (age of onset: 43) in her brother; Hyperlipidemia in her brother, father, and sister; Other in her brother, father, and sister.  ROS: Review of Systems  Musculoskeletal: Positive for joint pain (b/l knees, shoulders). Negative for falls.  All other systems reviewed and are negative.    Medications: Current Outpatient Medications  Medication Sig Dispense Refill  . Methylcellulose, Laxative, (CITRUCEL PO) Take by mouth.    Marland Kitchen acetaminophen (TYLENOL) 650 MG CR tablet Take 2 tablets (1,300 mg total) by mouth every 8 (eight) hours as  needed for pain. 90 tablet 3  . aspirin 81 MG tablet Take 81 mg by mouth daily.    . diclofenac (VOLTAREN) 75 MG EC tablet Take 1 tablet (75 mg total) by mouth 2 (two) times daily. 60 tablet 1  . Misc Natural Products (COSAMIN ASU ADVANCED FORMULA) CAPS Take 2 tablets by mouth 2 (two) times daily. 120 capsule 11   No current facility-administered medications for this visit.    Allergies  Allergen Reactions  . Simvastatin     Leg cramps       Objective:  BP 118/76   Pulse (!) 57   Ht 5\' 2"  (1.575 m)   Wt 122 lb (55.3 kg)   SpO2 96%   BMI 22.31 kg/m  Gen:  alert, not ill-appearing, no distress, appropriate for age HEENT: head normocephalic without obvious abnormality, conjunctiva and cornea clear, trachea midline Pulm: Normal work of breathing, normal phonation, clear to auscultation bilaterally, no wheezes, rales or rhonchi CV: Bradycardic, regular rhythm, s1 and s2 distinct, no murmurs, clicks or rubs  Neuro: alert and oriented x 3, no tremor MSK: Shoulders: there is left a/c joint tenderness, ROM slightly limited in flexion to 140  degrees bilaterally, positive Hawkin's sign on left; extremities atraumatic, strength intact in bilateral upper extremities, normal gait and station Skin: intact, no rashes on exposed skin, no jaundice, no cyanosis Psych: well-groomed, cooperative, good eye contact, euthymic mood, affect mood-congruent, speech is articulate, and thought processes clear and goal-directed  Depression screen [facility] 2/9 06/21/2017  Decreased Interest 0  Down, Depressed, Hopeless 0  PHQ - 2 Score 0     No results found for this or any previous visit (from the past 72 hour(s)). No results found.    Assessment and Plan: 51 y.o. female with   1. Encounter to establish care - reviewed PMH, PSH, PFH, medications and allergies - reviewed health maintenance - colonoscopy UTD - she is no longer a candidate for Pap smear - mammogram due - influenza UTD - due for  fasting labs  2. Breast cancer screening by mammogram - MM DIGITAL SCREENING BILATERAL; Future  3. Polyarthralgia - switching from Meloxicam to Diclofenac. Adding extra strength tylenol as needed. Follow-up with Sports Medicine in 2 weeks - acetaminophen (TYLENOL) 650 MG CR tablet; Take 2 tablets (1,300 mg total) by mouth every 8 (eight) hours as needed for pain.  Dispense: 90 tablet; Refill: 3 - diclofenac (VOLTAREN) 75 MG EC tablet; Take 1 tablet (75 mg total) by mouth 2 (two) times daily.  Dispense: 60 tablet; Refill: 1 - Misc Natural Products (COSAMIN ASU ADVANCED FORMULA) CAPS; Take 2 tablets by mouth 2 (two) times daily.  Dispense: 120 capsule; Refill: 11  4. Acute pain of both shoulders - differential includes rotator cuff injury / impingement syndrome. X-rays today. Rehab exercises daily. Daily anti-inflammatory. She will follow-up with Sports Medicine in 2 weeks - DG Shoulder Left; Future - DG Shoulder Right; Future  5. Encounter for screening for lipid disorder - Lipid Panel w/reflex Direct LDL  6. Screening for blood disease - CBC  7. Screening for diabetes mellitus - Comprehensive metabolic panel  8. H/O hysterectomy for benign disease - last Pap smear 2015, NILM and HPV negative - no history of malignancy. No further indication for Pap  9. Family history of early CAD - fasting lipids - monitor BP every 6 months - counseled on heart healthy diet and aerobic exercise - baby aspirin daily   Patient education and anticipatory guidance given Patient agrees with treatment plan Follow-up in 6 months or sooner as needed if symptoms worsen or fail to improve  Darlyne Russian PA-C

## 2017-06-21 NOTE — Patient Instructions (Addendum)
Downstairs for shoulder x-rays today Stop Meloxicam Diclofenac twice a day with food for 2 weeks. Then try to reduce to every other day. Inform us if you develop nausea, abdominal pain. Tylenol arthritis pain - 2 capsules every 8 hours as needed for joint pain Glucosamine-Chondroitin trial for 4 weeks Do your best to continue daily activities including low-impact exercise Shoulder rehab exercises daily Follow-up with Dr. Darene Lamer in 2 weeks  I have also ordered fasting labs. The lab is a walk-in open M-F 7:30a-4:30p (closed 12:30-1:30p). Nothing to eat or drink after midnight or at least 8 hours before your blood draw. You can have water and your medications.

## 2017-06-27 ENCOUNTER — Ambulatory Visit (INDEPENDENT_AMBULATORY_CARE_PROVIDER_SITE_OTHER): Payer: BLUE CROSS/BLUE SHIELD

## 2017-06-27 ENCOUNTER — Other Ambulatory Visit: Payer: Self-pay | Admitting: Physician Assistant

## 2017-06-27 DIAGNOSIS — Z1231 Encounter for screening mammogram for malignant neoplasm of breast: Secondary | ICD-10-CM

## 2017-06-27 DIAGNOSIS — Z130 Encounter for screening for diseases of the blood and blood-forming organs and certain disorders involving the immune mechanism: Secondary | ICD-10-CM | POA: Diagnosis not present

## 2017-06-27 DIAGNOSIS — Z131 Encounter for screening for diabetes mellitus: Secondary | ICD-10-CM | POA: Diagnosis not present

## 2017-06-27 DIAGNOSIS — Z13220 Encounter for screening for lipoid disorders: Secondary | ICD-10-CM | POA: Diagnosis not present

## 2017-06-27 LAB — COMPREHENSIVE METABOLIC PANEL
AG RATIO: 1.7 (calc) (ref 1.0–2.5)
ALT: 11 U/L (ref 6–29)
AST: 16 U/L (ref 10–35)
Albumin: 4.3 g/dL (ref 3.6–5.1)
Alkaline phosphatase (APISO): 44 U/L (ref 33–130)
BUN: 15 mg/dL (ref 7–25)
CO2: 29 mmol/L (ref 20–32)
CREATININE: 0.69 mg/dL (ref 0.50–1.05)
Calcium: 9.6 mg/dL (ref 8.6–10.4)
Chloride: 103 mmol/L (ref 98–110)
GLOBULIN: 2.5 g/dL (ref 1.9–3.7)
GLUCOSE: 89 mg/dL (ref 65–99)
Potassium: 4.5 mmol/L (ref 3.5–5.3)
SODIUM: 138 mmol/L (ref 135–146)
Total Bilirubin: 0.5 mg/dL (ref 0.2–1.2)
Total Protein: 6.8 g/dL (ref 6.1–8.1)

## 2017-06-27 LAB — CBC
HCT: 38 % (ref 35.0–45.0)
HEMOGLOBIN: 12.8 g/dL (ref 11.7–15.5)
MCH: 31.1 pg (ref 27.0–33.0)
MCHC: 33.7 g/dL (ref 32.0–36.0)
MCV: 92.2 fL (ref 80.0–100.0)
MPV: 11.7 fL (ref 7.5–12.5)
Platelets: 199 10*3/uL (ref 140–400)
RBC: 4.12 10*6/uL (ref 3.80–5.10)
RDW: 12.6 % (ref 11.0–15.0)
WBC: 5.4 10*3/uL (ref 3.8–10.8)

## 2017-06-27 LAB — LIPID PANEL W/REFLEX DIRECT LDL
Cholesterol: 195 mg/dL (ref <200)
HDL: 60 mg/dL (ref >50)
LDL Cholesterol (Calc): 111 mg/dL (calc) — ABNORMAL HIGH
NON-HDL CHOLESTEROL (CALC): 135 mg/dL — AB (ref <130)
Total CHOL/HDL Ratio: 3.3 (calc) (ref <5.0)
Triglycerides: 129 mg/dL (ref <150)

## 2017-06-28 NOTE — Progress Notes (Signed)
Frederik Pear news: your mammogram was negative. Recommend repeat screening in 1 year.  Happy Holidays! Evlyn Clines

## 2017-06-28 NOTE — Progress Notes (Signed)
Good morning Kelsay,  Your labs look great - normal kidney function - cholesterol in a healthy range - normal blood counts - no evidence of diabetes  Best, Evlyn Clines

## 2017-07-04 ENCOUNTER — Other Ambulatory Visit: Payer: Self-pay | Admitting: Physician Assistant

## 2017-07-04 ENCOUNTER — Other Ambulatory Visit: Payer: Self-pay | Admitting: Sports Medicine

## 2017-07-04 ENCOUNTER — Telehealth: Payer: Self-pay

## 2017-07-04 DIAGNOSIS — M150 Primary generalized (osteo)arthritis: Principal | ICD-10-CM

## 2017-07-04 DIAGNOSIS — K319 Disease of stomach and duodenum, unspecified: Secondary | ICD-10-CM

## 2017-07-04 DIAGNOSIS — M2550 Pain in unspecified joint: Secondary | ICD-10-CM

## 2017-07-04 DIAGNOSIS — T39395A Adverse effect of other nonsteroidal anti-inflammatory drugs [NSAID], initial encounter: Secondary | ICD-10-CM

## 2017-07-04 DIAGNOSIS — M159 Polyosteoarthritis, unspecified: Secondary | ICD-10-CM

## 2017-07-04 MED ORDER — CELECOXIB 100 MG PO CAPS: 100.0000 mg | ORAL_CAPSULE | Freq: Two times a day (BID) | ORAL | 0 refills | Status: DC | PRN

## 2017-07-04 MED ORDER — OMEPRAZOLE 40 MG PO CPDR: 40.0000 mg | DELAYED_RELEASE_CAPSULE | Freq: Every day | ORAL | 0 refills | Status: DC

## 2017-07-04 NOTE — Telephone Encounter (Signed)
Both Diclofenac and Meloxicam are NSAIDs that can cause stomach ulcers and GI bleeds. If she is having abdominal pain, I would prefer that she not take either medication for a few weeks and start Omeprazole 40 mg daily to treat possible NSAID-induced gastritis. She can follow-up with Dr. Georgina Snell, our other Sports doctor, this week if she would like. Once she has completed gastritis treatment, we can start her on a different type of NSAID, such as Celebrex, which has a lower risk of gastrointestinal complications. I would recommend we continue Omeprazole as prophylaxis while she is NSAID's long-term.

## 2017-07-04 NOTE — Telephone Encounter (Signed)
Patient called and stated she had side affects taking the Diclofenac tablets causing abdominal pain, increase HR and BP, Palpitation. Patient was taking Meloxicam in the past and wants to know if she could take that again or advise something else for her pain. Patient will see Dr. Darene Lamer on  07/18/2016. Advise patient to take Tylenol Arthritis per last chart advise on 06/21/17.

## 2017-07-04 NOTE — Telephone Encounter (Signed)
Patient understood the risk and will like the Rx sent to the pharmacy but states she will try the Tylenol arthritis and colchicine medication first to see if it will help with her pain. Please send medication to pharmacy for patient to have on hand just incase the Otc meds don't work for her.

## 2017-07-18 ENCOUNTER — Ambulatory Visit: Payer: BLUE CROSS/BLUE SHIELD | Admitting: Sports Medicine

## 2017-07-18 ENCOUNTER — Encounter: Payer: Self-pay | Admitting: Sports Medicine

## 2017-07-18 DIAGNOSIS — M7550 Bursitis of unspecified shoulder: Secondary | ICD-10-CM | POA: Diagnosis not present

## 2017-07-18 DIAGNOSIS — M2550 Pain in unspecified joint: Secondary | ICD-10-CM

## 2017-07-18 NOTE — Assessment & Plan Note (Signed)
At this point has failed NSAIDs, rehab exercises. Right subacromial injection as above. Return in 1 month.

## 2017-07-18 NOTE — Assessment & Plan Note (Signed)
Rheumatoid workup was negative. She has since switched her to Celebrex which seems to be working well, only doing 100 mg, I have advised her she can taper herself up to 400 mg. Continue Cosamin. Did not notice much efficacy with Tylenol.

## 2017-07-18 NOTE — Progress Notes (Signed)
Subjective:    CC: Recheck pain  HPI: Polyarthralgia: We have been try to find a good option for her, at this point Celebrex seems to have worked the best, no GI discomfort.  Right shoulder pain: Localized of the deltoid, worse with overhead activities, moderate, persistent without radiation.  As over-the-counter NSAIDs and rehab exercises have been ineffective she is agreeable to do injection.  I reviewed the past medical history, family history, social history, surgical history, and allergies today and no changes were needed.  Please see the problem list section below in epic for further details.  Past Medical History: Past Medical History:  Diagnosis Date  . Digestive problems   . Hyperlipidemia   . Joint pain   . 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 Open fracture of tuft of distal phalanx of finger 05/13/2012  . Osteoarthritis    Past Surgical History: Past Surgical History:  Procedure Laterality Date  . CESAREAN SECTION  2000, 2002   x2  . PARTIAL HYSTERECTOMY  2003   with ab muscle repair and hernia repair and bladder tacting for menorrahgia   Social History: Social History   Socioeconomic History  . Marital status: Married    Spouse name: None  . Number of children: None  . Years of education: None  . Highest education level: None  Social Needs  . Financial resource strain: None  . Food insecurity - worry: None  . Food insecurity - inability: None  . Transportation needs - medical: None  . Transportation needs - non-medical: None  Occupational History  . None  Tobacco Use  . Smoking status: Never Smoker  . Smokeless tobacco: Never Used  Substance and Sexual Activity  . Alcohol use: Yes    Alcohol/week: 1.8 oz    Types: 3 Cans of beer per week  . Drug use: No  . Sexual activity: Yes    Partners: Male    Birth control/protection: Surgical    Comment: HYST-1st intercourse 52 yo-Fewer than 5 partners  Other Topics Concern  . None    Social History Narrative  . None   Family History: Family History  Problem Relation Age of Onset  . Heart disease Father   . Other Father        familial polyposis  . Colon polyps Father   . Hyperlipidemia Father   . Heart disease Brother 49       MI  . Other Brother        2 brothers with FAP; colectomy in teens; APC positive  . Hyperlipidemia Brother   . Colon cancer Paternal Uncle 75       deceased  . Other Sister        4 sisters with FAP; colectomy in teens; APC positive  . Hyperlipidemia Sister    Allergies: Allergies  Allergen Reactions  . Diclofenac     Palpitations, abdominal pain  . Simvastatin     Leg cramps   Medications: See med rec.  Review of Systems: No fevers, chills, night sweats, weight loss, chest pain, or shortness of breath.   Objective:    General: Well Developed, well nourished, and in no acute distress.  Neuro: Alert and oriented x3, extra-ocular muscles intact, sensation grossly intact.  HEENT: Normocephalic, atraumatic, pupils equal round reactive to light, neck supple, no masses, no lymphadenopathy, thyroid nonpalpable.  Skin: Warm and dry, no rashes. Cardiac: Regular rate and rhythm, no murmurs rubs or gallops, no lower extremity edema.  Respiratory: Clear to auscultation bilaterally. Not using accessory muscles, speaking in full sentences. Right shoulder: Inspection reveals no abnormalities, atrophy or asymmetry. Palpation is normal with no tenderness over AC joint or bicipital groove. ROM is full in all planes. Positive Neer and Hawkin's tests, empty can. Speeds and Yergason's tests normal. No labral pathology noted with negative Obrien's, negative crank, negative clunk, and good stability. Normal scapular function observed. No painful arc and no drop arm sign. No apprehension sign  Procedure: Real-time Ultrasound Guided Injection of right subacromial bursa Device: GE Logiq E  Verbal informed consent obtained.  Time-out  conducted.  Noted no overlying erythema, induration, or other signs of local infection.  Skin prepped in a sterile fashion.  Local anesthesia: Topical Ethyl chloride.  With sterile technique and under real time ultrasound guidance: 1 cc kenalog 40, 1 cc lidocaine, 1 cc bupivacaine injected easily Completed without difficulty  Pain immediately resolved suggesting accurate placement of the medication.  Advised to call if fevers/chills, erythema, induration, drainage, or persistent bleeding.  Images permanently stored and available for review in the ultrasound unit.  Impression: Technically successful ultrasound guided injection.  Impression and Recommendations:    Subacromial bursitis At this point has failed NSAIDs, rehab exercises. Right subacromial injection as above. Return in 1 month.  Polyarthralgia Rheumatoid workup was negative. She has since switched her to Celebrex which seems to be working well, only doing 100 mg, I have advised her she can taper herself up to 400 mg. Continue Cosamin. Did not notice much efficacy with Tylenol.  ___________________________________________ Gwen Her. Dianah Field, M.D., ABFM., CAQSM. Primary Care and [place] Instructor of [place] of [facility] of Medicine

## 2017-07-31 ENCOUNTER — Other Ambulatory Visit: Payer: Self-pay | Admitting: Sports Medicine

## 2017-07-31 DIAGNOSIS — M2550 Pain in unspecified joint: Secondary | ICD-10-CM

## 2017-08-01 ENCOUNTER — Other Ambulatory Visit: Payer: Self-pay | Admitting: Physician Assistant

## 2017-08-01 DIAGNOSIS — M159 Polyosteoarthritis, unspecified: Secondary | ICD-10-CM

## 2017-08-01 DIAGNOSIS — M150 Primary generalized (osteo)arthritis: Principal | ICD-10-CM

## 2017-08-15 ENCOUNTER — Encounter: Payer: Self-pay | Admitting: Sports Medicine

## 2017-08-15 ENCOUNTER — Ambulatory Visit: Payer: BLUE CROSS/BLUE SHIELD | Admitting: Sports Medicine

## 2017-08-15 DIAGNOSIS — M7550 Bursitis of unspecified shoulder: Secondary | ICD-10-CM | POA: Diagnosis not present

## 2017-08-15 NOTE — Assessment & Plan Note (Signed)
Partial benefit from her initial subacromial injection, repeating the subacromial injection today. If she does not have sufficient relief we will proceed with MRI for arthroscopy planning. I am also increasing to a heavier Thera-Band for her home rehab.

## 2017-08-15 NOTE — Progress Notes (Signed)
Subjective:    CC: Right shoulder pain  HPI: Kathy Hodges returns, she is a pleasant 52 year old female, I been treating her for right shoulder impingement syndrome.  After failure of physical therapy, NSAIDs we did a subacromial injection that provided about 50% relief, still has a bit of pain, over the deltoid, worse with abduction.  Moderate, persistent.  I reviewed the past medical history, family history, social history, surgical history, and allergies today and no changes were needed.  Please see the problem list section below in epic for further details.  Past Medical History: Past Medical History:  Diagnosis Date  . Digestive problems   . Hyperlipidemia   . Joint pain   . 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 Open fracture of tuft of distal phalanx of finger 05/13/2012  . Osteoarthritis    Past Surgical History: Past Surgical History:  Procedure Laterality Date  . CESAREAN SECTION  2000, 2002   x2  . PARTIAL HYSTERECTOMY  2003   with ab muscle repair and hernia repair and bladder tacting for menorrahgia   Social History: Social History   Socioeconomic History  . Marital status: Married    Spouse name: None  . Number of children: None  . Years of education: None  . Highest education level: None  Social Needs  . Financial resource strain: None  . Food insecurity - worry: None  . Food insecurity - inability: None  . Transportation needs - medical: None  . Transportation needs - non-medical: None  Occupational History  . None  Tobacco Use  . Smoking status: Never Smoker  . Smokeless tobacco: Never Used  Substance and Sexual Activity  . Alcohol use: Yes    Alcohol/week: 1.8 oz    Types: 3 Cans of beer per week  . Drug use: No  . Sexual activity: Yes    Partners: Male    Birth control/protection: Surgical    Comment: HYST-1st intercourse 52 yo-Fewer than 5 partners  Other Topics Concern  . None  Social History Narrative  . None   Family  History: Family History  Problem Relation Age of Onset  . Heart disease Father   . Other Father        familial polyposis  . Colon polyps Father   . Hyperlipidemia Father   . Heart disease Brother 84       MI  . Other Brother        2 brothers with FAP; colectomy in teens; APC positive  . Hyperlipidemia Brother   . Colon cancer Paternal Uncle 22       deceased  . Other Sister        4 sisters with FAP; colectomy in teens; APC positive  . Hyperlipidemia Sister    Allergies: Allergies  Allergen Reactions  . Diclofenac     Palpitations, abdominal pain  . Simvastatin     Leg cramps   Medications: See med rec.  Review of Systems: No fevers, chills, night sweats, weight loss, chest pain, or shortness of breath.   Objective:    General: Well Developed, well nourished, and in no acute distress.  Neuro: Alert and oriented x3, extra-ocular muscles intact, sensation grossly intact.  HEENT: Normocephalic, atraumatic, pupils equal round reactive to light, neck supple, no masses, no lymphadenopathy, thyroid nonpalpable.  Skin: Warm and dry, no rashes. Cardiac: Regular rate and rhythm, no murmurs rubs or gallops, no lower extremity edema.  Respiratory: Clear to auscultation bilaterally. Not using accessory  muscles, speaking in full sentences. Right shoulder: Inspection reveals no abnormalities, atrophy or asymmetry. Palpation is normal with no tenderness over AC joint or bicipital groove. ROM is full in all planes. Rotator cuff strength normal throughout. Positive Neer and Hawkin's tests, empty can. Speeds and Yergason's tests normal. No labral pathology noted with negative Obrien's, negative crank, negative clunk, and good stability. Normal scapular function observed. No painful arc and no drop arm sign. No apprehension sign  Procedure: Real-time Ultrasound Guided Injection of  right subacromial bursa  device: GE Logiq E  Verbal informed consent obtained.  Time-out conducted.    Noted no overlying erythema, induration, or other signs of local infection.  Skin prepped in a sterile fashion.  Local anesthesia: Topical Ethyl chloride.  With sterile technique and under real time ultrasound guidance: I advanced a 25-gauge needle under the acromion, and injected 1 cc kenalog 40, 1 cc lidocaine, 1 cc bupivacaine. Completed without difficulty  Pain immediately resolved suggesting accurate placement of the medication.  Advised to call if fevers/chills, erythema, induration, drainage, or persistent bleeding.  Images permanently stored and available for review in the ultrasound unit.  Impression: Technically successful ultrasound guided injection.  Impression and Recommendations:    Subacromial bursitis Partial benefit from her initial subacromial injection, repeating the subacromial injection today. If she does not have sufficient relief we will proceed with MRI for arthroscopy planning. I am also increasing to a heavier Thera-Band for her home rehab. ___________________________________________ Gwen Her. Dianah Field, M.D., ABFM., CAQSM. Primary Care and [place] Instructor of [place] of [facility] of Medicine

## 2017-09-03 ENCOUNTER — Other Ambulatory Visit: Payer: Self-pay | Admitting: Physician Assistant

## 2017-09-03 DIAGNOSIS — M150 Primary generalized (osteo)arthritis: Principal | ICD-10-CM

## 2017-09-03 DIAGNOSIS — M159 Polyosteoarthritis, unspecified: Secondary | ICD-10-CM

## 2017-09-12 ENCOUNTER — Ambulatory Visit: Payer: BLUE CROSS/BLUE SHIELD | Admitting: Sports Medicine

## 2017-09-19 ENCOUNTER — Encounter: Payer: Self-pay | Admitting: Sports Medicine

## 2017-09-19 ENCOUNTER — Ambulatory Visit: Payer: BLUE CROSS/BLUE SHIELD | Admitting: Sports Medicine

## 2017-09-19 DIAGNOSIS — M7550 Bursitis of unspecified shoulder: Secondary | ICD-10-CM | POA: Diagnosis not present

## 2017-09-19 DIAGNOSIS — M150 Primary generalized (osteo)arthritis: Secondary | ICD-10-CM | POA: Diagnosis not present

## 2017-09-19 DIAGNOSIS — M159 Polyosteoarthritis, unspecified: Secondary | ICD-10-CM

## 2017-09-19 MED ORDER — CELECOXIB 100 MG PO CAPS: 300.0000 mg | ORAL_CAPSULE | Freq: Every day | ORAL | 3 refills | Status: DC

## 2017-09-19 NOTE — Progress Notes (Signed)
Subjective:    CC: Follow-up  HPI: This is a pleasant 52 year old female, we have been treating her for right subacromial bursitis, she has had a couple of injections, overall doing extremely well.    I reviewed the past medical history, family history, social history, surgical history, and allergies today and no changes were needed.  Please see the problem list section below in epic for further details.  Past Medical History: Past Medical History:  Diagnosis Date  . Digestive problems   . Hyperlipidemia   . Joint pain   . 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 Open fracture of tuft of distal phalanx of finger 05/13/2012  . Osteoarthritis    Past Surgical History: Past Surgical History:  Procedure Laterality Date  . CESAREAN SECTION  2000, 2002   x2  . PARTIAL HYSTERECTOMY  2003   with ab muscle repair and hernia repair and bladder tacting for menorrahgia   Social History: Social History   Socioeconomic History  . Marital status: Married    Spouse name: None  . Number of children: None  . Years of education: None  . Highest education level: None  Social Needs  . Financial resource strain: None  . Food insecurity - worry: None  . Food insecurity - inability: None  . Transportation needs - medical: None  . Transportation needs - non-medical: None  Occupational History  . None  Tobacco Use  . Smoking status: Never Smoker  . Smokeless tobacco: Never Used  Substance and Sexual Activity  . Alcohol use: Yes    Alcohol/week: 1.8 oz    Types: 3 Cans of beer per week  . Drug use: No  . Sexual activity: Yes    Partners: Male    Birth control/protection: Surgical    Comment: HYST-1st intercourse 52 yo-Fewer than 5 partners  Other Topics Concern  . None  Social History Narrative  . None   Family History: Family History  Problem Relation Age of Onset  . Heart disease Father   . Other Father        familial polyposis  . Colon polyps Father     . Hyperlipidemia Father   . Heart disease Brother 50       MI  . Other Brother        2 brothers with FAP; colectomy in teens; APC positive  . Hyperlipidemia Brother   . Colon cancer Paternal Uncle 50       deceased  . Other Sister        4 sisters with FAP; colectomy in teens; APC positive  . Hyperlipidemia Sister    Allergies: Allergies  Allergen Reactions  . Diclofenac     Palpitations, abdominal pain  . Simvastatin     Leg cramps   Medications: See med rec.  Review of Systems: No fevers, chills, night sweats, weight loss, chest pain, or shortness of breath.   Objective:    General: Well Developed, well nourished, and in no acute distress.  Neuro: Alert and oriented x3, extra-ocular muscles intact, sensation grossly intact.  HEENT: Normocephalic, atraumatic, pupils equal round reactive to light, neck supple, no masses, no lymphadenopathy, thyroid nonpalpable.  Skin: Warm and dry, no rashes. Cardiac: Regular rate and rhythm, no murmurs rubs or gallops, no lower extremity edema.  Respiratory: Clear to auscultation bilaterally. Not using accessory muscles, speaking in full sentences. Right Shoulder: Inspection reveals no abnormalities, atrophy or asymmetry. Palpation is normal with no tenderness over [facility]  joint or bicipital groove. ROM is full in all planes. Rotator cuff strength normal throughout. No signs of impingement with negative Neer and Hawkin's tests, empty can. Speeds and Yergason's tests normal. No labral pathology noted with negative Obrien's, negative crank, negative clunk, and good stability. Normal scapular function observed. No painful arc and no drop arm sign. No apprehension sign  Impression and Recommendations:    Subacromial bursitis Partial benefit from the initial subacromial injection, we repeated the subacromial injection at the last visit, she has good relief, but still a bit of discomfort with abduction, improving with physical therapy at home,  we have increased to a heavier Thera-Band. At this point we will stay status quo, I am going to increase her Celebrex dosing. She can return to see me as needed, next step would be MRI for arthroscopy planning if she has a recurrence of pain very soon, otherwise we could consider another injection in 3 or 4 months. ___________________________________________ Gwen Her. Dianah Field, M.D., ABFM., CAQSM. Primary Care and [place] Instructor of [place] of [facility] of Medicine

## 2017-09-19 NOTE — Assessment & Plan Note (Signed)
Partial benefit from the initial subacromial injection, we repeated the subacromial injection at the last visit, she has good relief, but still a bit of discomfort with abduction, improving with physical therapy at home, we have increased to a heavier Thera-Band. At this point we will stay status quo, I am going to increase her Celebrex dosing. She can return to see me as needed, next step would be MRI for arthroscopy planning if she has a recurrence of pain very soon, otherwise we could consider another injection in 3 or 4 months.

## 2017-09-28 ENCOUNTER — Telehealth: Payer: Self-pay | Admitting: Sports Medicine

## 2017-09-28 NOTE — Telephone Encounter (Signed)
Received fax from [place] that Celebrex was approved from 09/28/2017 until 09/28/2020. Pharmacy notified and forms sent to scan.   Reference ID: [id number].

## 2017-09-28 NOTE — Telephone Encounter (Signed)
Received fax from [employer] that Celebrex requires a PA. Information has been sent to the insurance company. Awaiting determination.   

## 2017-11-06 ENCOUNTER — Other Ambulatory Visit: Payer: Self-pay | Admitting: Physician Assistant

## 2017-11-06 DIAGNOSIS — M150 Primary generalized (osteo)arthritis: Principal | ICD-10-CM

## 2017-11-06 DIAGNOSIS — M159 Polyosteoarthritis, unspecified: Secondary | ICD-10-CM

## 2017-11-07 ENCOUNTER — Other Ambulatory Visit: Payer: Self-pay | Admitting: Sports Medicine

## 2017-11-07 DIAGNOSIS — M150 Primary generalized (osteo)arthritis: Principal | ICD-10-CM

## 2017-11-07 DIAGNOSIS — M159 Polyosteoarthritis, unspecified: Secondary | ICD-10-CM

## 2017-11-30 ENCOUNTER — Telehealth: Payer: Self-pay

## 2017-11-30 DIAGNOSIS — M150 Primary generalized (osteo)arthritis: Principal | ICD-10-CM

## 2017-11-30 DIAGNOSIS — M159 Polyosteoarthritis, unspecified: Secondary | ICD-10-CM

## 2017-11-30 MED ORDER — CELECOXIB 100 MG PO CAPS: 100.0000 mg | ORAL_CAPSULE | Freq: Every day | ORAL | 3 refills | Status: DC

## 2017-11-30 NOTE — Telephone Encounter (Signed)
Pt left VM asking that her Celebrex rx be adjusted to read 1-2 capsules by mouth daily. States her insurance will only pay for this dose. Please advise.

## 2017-11-30 NOTE — Telephone Encounter (Signed)
Done.  She could go up to a 200mg  tab BID, fyi for her.

## 2018-04-30 DIAGNOSIS — B351 Tinea unguium: Secondary | ICD-10-CM | POA: Diagnosis not present

## 2018-04-30 DIAGNOSIS — L600 Ingrowing nail: Secondary | ICD-10-CM | POA: Diagnosis not present

## 2018-05-29 DIAGNOSIS — Z23 Encounter for immunization: Secondary | ICD-10-CM | POA: Diagnosis not present

## 2018-06-18 ENCOUNTER — Telehealth: Payer: Self-pay | Admitting: *Deleted

## 2018-06-18 DIAGNOSIS — Z8261 Family history of arthritis: Secondary | ICD-10-CM

## 2018-06-18 NOTE — Telephone Encounter (Signed)
Referral placed per pt request 

## 2018-08-06 ENCOUNTER — Encounter: Payer: Self-pay | Admitting: Physician Assistant

## 2018-08-06 DIAGNOSIS — M2550 Pain in unspecified joint: Secondary | ICD-10-CM | POA: Diagnosis not present

## 2018-08-07 DIAGNOSIS — B351 Tinea unguium: Secondary | ICD-10-CM | POA: Diagnosis not present

## 2018-08-07 DIAGNOSIS — L600 Ingrowing nail: Secondary | ICD-10-CM | POA: Diagnosis not present

## 2018-11-10 ENCOUNTER — Other Ambulatory Visit: Payer: Self-pay | Admitting: Sports Medicine

## 2018-11-10 DIAGNOSIS — M150 Primary generalized (osteo)arthritis: Principal | ICD-10-CM

## 2018-11-10 DIAGNOSIS — M159 Polyosteoarthritis, unspecified: Secondary | ICD-10-CM

## 2018-11-10 NOTE — Telephone Encounter (Signed)
appt needed for refill

## 2018-11-11 NOTE — Telephone Encounter (Signed)
Patient states she is going to try to not take the medication. She will call back to schedule an appointment if needed.

## 2019-01-29 ENCOUNTER — Encounter: Payer: Self-pay | Admitting: Gastroenterology

## 2019-04-02 ENCOUNTER — Encounter: Payer: Self-pay | Admitting: Gynecology

## 2019-08-18 ENCOUNTER — Other Ambulatory Visit: Payer: Self-pay | Admitting: Physician Assistant

## 2019-08-18 DIAGNOSIS — Z1231 Encounter for screening mammogram for malignant neoplasm of breast: Secondary | ICD-10-CM

## 2019-09-03 ENCOUNTER — Ambulatory Visit (INDEPENDENT_AMBULATORY_CARE_PROVIDER_SITE_OTHER): Payer: BC Managed Care – PPO

## 2019-09-03 ENCOUNTER — Other Ambulatory Visit: Payer: Self-pay

## 2019-09-03 DIAGNOSIS — Z1231 Encounter for screening mammogram for malignant neoplasm of breast: Secondary | ICD-10-CM

## 2020-04-12 ENCOUNTER — Ambulatory Visit (INDEPENDENT_AMBULATORY_CARE_PROVIDER_SITE_OTHER): Payer: BC Managed Care – PPO | Admitting: Obstetrics and Gynecology

## 2020-04-12 ENCOUNTER — Other Ambulatory Visit: Payer: Self-pay

## 2020-04-12 ENCOUNTER — Encounter: Payer: Self-pay | Admitting: Obstetrics and Gynecology

## 2020-04-12 VITALS — BP 124/78 | Ht 62.0 in | Wt 119.0 lb

## 2020-04-12 DIAGNOSIS — Z01419 Encounter for gynecological examination (general) (routine) without abnormal findings: Secondary | ICD-10-CM

## 2020-04-12 DIAGNOSIS — Z124 Encounter for screening for malignant neoplasm of cervix: Secondary | ICD-10-CM

## 2020-04-12 DIAGNOSIS — Z1272 Encounter for screening for malignant neoplasm of vagina: Secondary | ICD-10-CM | POA: Diagnosis not present

## 2020-04-12 NOTE — Progress Notes (Signed)
   [place] Bryngelson 02/18/1966 [id number]  SUBJECTIVE:  54 y.o. [id number] female for annual routine gynecologic exam. Last seen in this office 02/2016. She has no gynecologic concerns.  Current Outpatient Medications  Medication Sig Dispense Refill  . Methylcellulose, Laxative, (CITRUCEL PO) Take by mouth.     No current facility-administered medications for this visit.   Allergies: Diclofenac and Simvastatin  No LMP recorded. Patient has had a hysterectomy.  Past medical history,surgical history, problem list, medications, allergies, family history and social history were all reviewed and documented as reviewed in the EPIC chart.  ROS: Pertinent positives and negatives as reviewed in HPI   OBJECTIVE:  BP 124/78   Ht 5\' 2"  (1.575 m)   Wt 119 lb (54 kg)   BMI 21.77 kg/m  The patient appears well, alert, oriented x 3, in no distress. ENT normal.  Neck supple. No cervical or supraclavicular adenopathy or thyromegaly.  Lungs are clear, good air entry, no wheezes, rhonchi or rales. S1 and S2 normal, no murmurs, regular rate and rhythm.  Abdomen soft without tenderness, guarding, mass or organomegaly.  Neurological is normal, no focal findings.  BREAST EXAM: breasts appear normal, no suspicious masses, no skin or nipple changes or axillary nodes  PELVIC EXAM: VULVA: normal appearing vulva with no masses, tenderness or lesions, VAGINA: normal appearing vagina with normal color and discharge, no lesions, CERVIX: surgically absent, UTERUS: surgically absent, vaginal cuff normal, ADNEXA: no masses, nontender, PAP: Pap smear done today, thin-prep method  Chaperone: [name] present during the examination  ASSESSMENT:  54 y.o. [id number] here for annual gynecologic exam  PLAN:   1. Postmenopausal. Prior TAH for menorrhagia in 2003. No significant menopausal symptoms. 2. Pap smear 2013. No significant history of abnormal Pap smears.  We discussed Pap smear screening intervals and recommendations  on the guidelines.  We decided that we would get a Pap smear today from the vaginal cuff and moving forward will revisit how she would like to continue with surveillance. 3. Mammogram 08/2019.  Normal breast exam today.  Continue with annual mammograms. 4. Colonoscopy 2015. She will follow up at the interval recommended by her GI specialist, she indicates that she will be due next year. 5. DEXA never. Would recommend further into menopause.  She does get dairy in her diet and says she recently had a vitamin D level checked with her PCP and it was normal. 6. Health maintenance.  No labs today as she normally has these completed elsewhere.  Return annually or sooner, prn.  Joseph Pierini MD 04/12/20

## 2020-04-13 LAB — PAP IG W/ RFLX HPV ASCU

## 2020-12-30 ENCOUNTER — Encounter: Payer: Self-pay | Admitting: Gastroenterology

## 2021-01-19 ENCOUNTER — Other Ambulatory Visit: Payer: Self-pay | Admitting: Physician Assistant

## 2021-01-19 DIAGNOSIS — Z1231 Encounter for screening mammogram for malignant neoplasm of breast: Secondary | ICD-10-CM

## 2021-02-16 ENCOUNTER — Encounter: Payer: Self-pay | Admitting: Gastroenterology

## 2021-02-16 ENCOUNTER — Ambulatory Visit (INDEPENDENT_AMBULATORY_CARE_PROVIDER_SITE_OTHER): Payer: 59 | Admitting: Gastroenterology

## 2021-02-16 DIAGNOSIS — K5900 Constipation, unspecified: Secondary | ICD-10-CM | POA: Diagnosis not present

## 2021-02-16 DIAGNOSIS — R194 Change in bowel habit: Secondary | ICD-10-CM | POA: Diagnosis not present

## 2021-02-16 MED ORDER — LINACLOTIDE 72 MCG PO CAPS: 72.0000 ug | ORAL_CAPSULE | Freq: Every day | ORAL | 6 refills | Status: AC

## 2021-02-16 NOTE — Patient Instructions (Addendum)
If you are age 55 or younger, your body mass index should be between 19-25. Your Body mass index is 22.67 kg/m. If this is out of the aformentioned range listed, please consider follow up with your Primary Care Provider.   __________________________________________________________  The [facility] providers would like to encourage you to use [facility] to communicate with providers for non-urgent requests or questions.  Due to long hold times on the telephone, sending your provider a message by [facility] may be a faster and more efficient way to get a response.  Please allow 48 business hours for a response.  Please remember that this is for non-urgent requests.   You have been scheduled for a colonoscopy. Please follow written instructions given to you at your visit today.  Please pick up your prep supplies at the pharmacy within the next 1-3 days. If you use inhalers (even only as needed), please bring them with you on the day of your procedure.  Due to recent changes in healthcare laws, you may see the results of your imaging and laboratory studies on MyChart before your provider has had a chance to review them.  We understand that in some cases there may be results that are confusing or concerning to you. Not all laboratory results come back in the same time frame and the provider may be waiting for multiple results in order to interpret others.  Please give [place] 48 hours in order for your provider to thoroughly review all the results before contacting the office for clarification of your results.   We have sent the following medications to your pharmacy for you to pick up at your convenience:  START: Linzess 72 mcg take once daily. Samples given today.  CONTINUE: Citrucel daily.  Please make sure you are staying well hydrated each day by drinking 8 to 10 8oz glasses of water daily.  Thank you for entrusting me with your care and choosing [facility].  Dr Ardis Hughs

## 2021-02-16 NOTE — Progress Notes (Signed)
HPI: This is a very pleasant 55 year old woman who was referred to me by Hermenia Fiscal, FNP  to evaluate constipation.      did a colonoscopy and upper endoscopy for her August 2015 for right lower quadrant pain, alternating bowel habits.  The EGD showed mild nonspecific gastritis.  Biopsies showed no sign of H. pylori.  The colon was normal except that there was some mild erythematous mucosa in the rectum.  Biopsies showed that this was likely prep effect.  No polyps or cancers were noted.  She has rather mild chronic constipation which requires Citrucel on a daily basis.  She takes the pill form.  She has had 3 episodes of severe constipation spaced about 4 to 6 months apart over the past couple years.  These resulted in fecal impactions.  1 of these impactions was resolved with enemas.  Another was resolved with just 1 or 2 doses of MiraLAX.  She was advised to try Linzess during one of her impactions and that did not help at all.  It probably made things worse actually.  She does not see blood in her stool.  Her weight is overall stable   Review of systems: Pertinent positive and negative review of systems were noted in the above HPI section. All other review negative.   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     No current facility-administered medications for this visit.    Allergies as of 02/16/2021 - Review Complete 02/16/2021  Allergen Reaction Noted    Diclofenac  07/04/2017   Simvastatin  02/13/2014    Family History  Problem Relation Age of Onset   Heart disease Father    Other Father        familial polyposis   Colon polyps Father    Hyperlipidemia Father    Heart disease Brother 79       MI   Other Brother        2 brothers with FAP; colectomy in teens; APC positive   Hyperlipidemia Brother    Colon cancer Brother    Barrett's esophagus Brother    Colon cancer Paternal Uncle 67       deceased   Other Sister        17 sisters with FAP; colectomy in teens; APC positive   Hyperlipidemia Sister    Colon cancer Sister    Barrett's esophagus Mother     Social History   Socioeconomic History   Marital status: Married    Spouse name: Not on file   Number of children: Not on file   Years of education: Not on file   Highest education level: Not on file  Occupational History   Not on file  Tobacco Use   Smoking status: Never   Smokeless tobacco: Never  Vaping Use   Vaping Use: Never used  Substance and Sexual Activity   Alcohol use: Yes  Alcohol/week: 3.0 standard drinks    Types: 3 Cans of beer per week   Drug use: No   Sexual activity: Yes    Partners: Male    Birth control/protection: Surgical    Comment: HYST-1st intercourse 55 yo-Fewer than 5 partners  Other Topics Concern   Not on file  Social History Narrative   Not on file   Social Determinants of Health   Financial Resource Strain: Not on file  Food Insecurity: Not on file  Transportation Needs: Not on file  Physical Activity: Not on file  Stress: Not on file  Social Connections: Not on file  Intimate Partner Violence: Not on file     Physical Exam: BP 120/80   Pulse 62   Ht '5\' 1"'$  (1.549 m)   Wt 120 lb (54.4 kg)   BMI 22.67 kg/m  Constitutional: generally well-appearing Psychiatric: alert and oriented x3 Eyes: extraocular movements intact Mouth: oral pharynx moist, no lesions Neck: supple no lymphadenopathy Cardiovascular: heart  regular rate and rhythm Lungs: clear to auscultation bilaterally Abdomen: soft, nontender, nondistended, no obvious ascites, no peritoneal signs, normal bowel sounds Extremities: no lower extremity edema bilaterally Skin: no lesions on visible extremities   Assessment and plan: 55 y.o. female with change in bowel habits, fecal impactions, mild chronic constipation  I did not mention above but she does stay quite hydrated throughout the day and I recommend she continue doing that.  I recommend she continue taking her Citrucel pill on a daily basis.  I gave her prescription for Linzess 72 mcg pills 1 pill once daily as a trial.  Given her change in bowel habits I recommended repeating colonoscopy, it has been 7 years since her last 1.  She understands that MiraLAX can certainly safely be used as a rescue measure if she has significant constipation event again.  My hope is to prevent those events from happening in the future.  I did explain to her that I was pretty surprised that a single dose or 2 of MiraLAX was so effective at treating what sounded like an actual fecal impaction.   Please see the "Patient Instructions" section for addition details about the plan.   [name], MD [facility] 02/16/2021, 10:36 AM  Cc: Hermenia Fiscal, FNP  Total time on date of encounter was 45 minutes (this included time spent preparing to see the patient reviewing records; obtaining and/or reviewing separately obtained history; performing a medically appropriate exam and/or evaluation; counseling and educating the patient and family if present; ordering medications, tests or procedures if applicable; and documenting clinical information in the health record).

## 2021-02-17 ENCOUNTER — Other Ambulatory Visit (HOSPITAL_BASED_OUTPATIENT_CLINIC_OR_DEPARTMENT_OTHER): Payer: Self-pay | Admitting: Obstetrics and Gynecology

## 2021-02-17 ENCOUNTER — Other Ambulatory Visit: Payer: Self-pay

## 2021-02-17 ENCOUNTER — Ambulatory Visit (INDEPENDENT_AMBULATORY_CARE_PROVIDER_SITE_OTHER): Payer: 59

## 2021-02-17 DIAGNOSIS — Z1231 Encounter for screening mammogram for malignant neoplasm of breast: Secondary | ICD-10-CM

## 2021-04-01 ENCOUNTER — Telehealth: Payer: Self-pay | Admitting: Gastroenterology

## 2021-04-01 NOTE — Telephone Encounter (Signed)
Left message on voice mail for patient to hold any foods that are high in fiber 5 days prior to procedure.

## 2021-04-01 NOTE — Telephone Encounter (Signed)
Inbound call from patient asking if she can continue to eat raisin bran cereal since she has to stop certain foods 5 days before.  Requests if she does not answer, to leave detailed voice mail please.

## 2021-04-08 ENCOUNTER — Encounter: Payer: Self-pay | Admitting: Gastroenterology

## 2021-04-08 ENCOUNTER — Other Ambulatory Visit: Payer: Self-pay

## 2021-04-08 ENCOUNTER — Ambulatory Visit (AMBULATORY_SURGERY_CENTER): Payer: 59 | Admitting: Gastroenterology

## 2021-04-08 VITALS — BP 126/66 | HR 45 | Temp 97.9°F | Resp 11 | Ht 61.0 in | Wt 120.0 lb

## 2021-04-08 DIAGNOSIS — K5900 Constipation, unspecified: Secondary | ICD-10-CM | POA: Diagnosis not present

## 2021-04-08 DIAGNOSIS — R194 Change in bowel habit: Secondary | ICD-10-CM | POA: Diagnosis present

## 2021-04-08 HISTORY — PX: COLONOSCOPY: SHX174

## 2021-04-08 MED ORDER — SODIUM CHLORIDE 0.9 % IV SOLN: 500.0000 mL | Freq: Once | INTRAVENOUS | Status: DC

## 2021-04-08 NOTE — Progress Notes (Signed)
PT taken to PACU. Monitors in place. VSS. Report given to RN. 

## 2021-04-08 NOTE — Progress Notes (Signed)
HPI: This is a woman with FH of colon cancer, change in bowel habtis   ROS: complete GI ROS as described in HPI, all other review negative.  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     linaclotide (LINZESS) 72 MCG capsule Take 1 capsule (72 mcg total) by mouth daily before breakfast. (Patient not taking: Reported on 04/08/2021) 30 capsule 6   Current Facility-Administered Medications  Medication Dose Route Frequency Provider Last Rate Last Admin   0.9 %  sodium chloride infusion  500 mL Intravenous Once Milus Banister, MD        Allergies as of 04/08/2021 - Review Complete 04/08/2021  Allergen Reaction Noted   Diclofenac  07/04/2017   Simvastatin  02/13/2014    Family History  Problem Relation Age of Onset   Esophageal cancer Mother        early 68's   Barrett's esophagus Mother    Heart disease Father    Other Father        familial polyposis   Colon polyps Father    Hyperlipidemia Father    Other Sister        4 sisters with FAP; colectomy in teens; APC positive   Hyperlipidemia Sister    Colon cancer Sister    Heart disease Brother 53       MI   Other Brother        2 brothers with FAP; colectomy in teens; APC positive   Hyperlipidemia Brother    Colon cancer Brother    Barrett's esophagus Brother    Esophageal cancer Brother        onset in 10's   Pancreatic cancer  Brother    Colon cancer Paternal Uncle 59       deceased   Stomach cancer Neg Hx    Rectal cancer Neg Hx     Social History   Socioeconomic History   Marital status: Married    Spouse name: Not on file   Number of children: Not on file   Years of education: Not on file   Highest education level: Not on file  Occupational History   Not on file  Tobacco Use   Smoking status: Never   Smokeless tobacco: Never  Vaping Use   Vaping Use: Never used  Substance and Sexual Activity   Alcohol use: Yes    Alcohol/week: 3.0 standard drinks    Types: 3 Cans of beer per week   Drug use: No   Sexual activity: Yes    Partners: Male    Birth control/protection: Surgical    Comment: HYST-1st intercourse 55 yo-Fewer than 5 partners  Other Topics Concern   Not on file  Social History Narrative   Not on file   Social Determinants of Health   Financial Resource Strain: Not on file  Food  Insecurity: Not on file  Transportation Needs: Not on file  Physical Activity: Not on file  Stress: Not on file  Social Connections: Not on file  Intimate Partner Violence: Not on file     Physical Exam: BP 135/73   Pulse (!) 52   Temp 97.9 F (36.6 C)   Ht 5\' 1"  (1.549 m)   Wt 120 lb (54.4 kg)   SpO2 100%   BMI 22.67 kg/m  Constitutional: generally well-appearing Psychiatric: alert and oriented x3 Lungs: CTA bilaterally Heart: no MCR  Assessment and plan: 55 y.o. female with fh of colon cancer, change in bowel habits  Colonsocopyy today  Care is appropriate for the ambulatory setting.  [name], MD [facility] 04/08/2021, 9:40 AM

## 2021-04-08 NOTE — Progress Notes (Signed)
Vital signs checked by:CW ? ?The medical and surgical history was reviewed and verified with the patient. ? ?

## 2021-04-08 NOTE — Addendum Note (Signed)
Addended by: Dustin Flock T on: 04/08/2021 10:48 AM   Modules accepted: Orders

## 2021-04-08 NOTE — Op Note (Signed)
[facility] Patient Name: Kathy Hodges Procedure Date: 04/08/2021 9:33 AM MRN: [id number] Endoscopist: Milus Banister , MD Age: 55 Referring MD:  Date of Birth: [birth year] Gender: Female Account #: [id number] Procedure:                Colonoscopy Indications:              Change in bowel habits; FAP runs in her family                            however she does not have the APC genetic defect.                            Colonoscopy 2015 was normal. Medicines:                Monitored Anesthesia Care Procedure:                Pre-Anesthesia Assessment:                           - Prior to the procedure, a History and Physical                            was performed, and patient medications and                            allergies were reviewed. The patient's tolerance of                            previous anesthesia was also reviewed. The risks                            and benefits of the procedure and the sedation                            options and risks were discussed with the patient.                            All questions were answered, and informed consent                            was obtained. Prior Anticoagulants: The patient has                            taken no previous anticoagulant or antiplatelet                            agents. ASA Grade Assessment: II - A patient with                            mild systemic disease. After reviewing the risks                            and benefits, the patient was deemed in  satisfactory condition to undergo the procedure.                           After obtaining informed consent, the colonoscope                            was passed under direct vision. Throughout the                            procedure, the patient's blood pressure, pulse, and                            oxygen saturations were monitored continuously. The                            CF [id number] #[id number] was introduced  through the anus                            and advanced to the the cecum, identified by                            appendiceal orifice and ileocecal valve. The                            colonoscopy was performed without difficulty. The                            patient tolerated the procedure well. The quality                            of the bowel preparation was good. The ileocecal                            valve, appendiceal orifice, and rectum were                            photographed. Scope In: 9:47:50 AM Scope Out: 9:59:57 AM Scope Withdrawal Time: 0 hours 7 minutes 16 seconds  Total Procedure Duration: 0 hours 12 minutes 7 seconds  Findings:                 The entire examined colon appeared normal on direct                            and retroflexion views. Complications:            No immediate complications. Estimated blood loss:                            None. Estimated Blood Loss:     Estimated blood loss: none. Impression:               - The entire examined colon is normal on direct and                            retroflexion views.                           -  No polyps or cancers. Recommendation:           - Patient has a contact number available for                            emergencies. The signs and symptoms of potential                            delayed complications were discussed with the                            patient. Return to normal activities tomorrow.                            Written discharge instructions were provided to the                            patient.                           - Resume previous diet.                           - Continue present medications.                           - Repeat colonoscopy in 10 years for screening.                           - NGI office visit with Dr. Ardis Hughs, next available                            to discuss FH of esophageal cancer. Milus Banister, MD 04/08/2021 10:02:34 AM This report has been  signed electronically.

## 2021-04-08 NOTE — Patient Instructions (Signed)
YOU HAD AN ENDOSCOPIC PROCEDURE TODAY AT THE [facility]:   Refer to the procedure report that was given to you for any specific questions about what was found during the examination.  If the procedure report does not answer your questions, please call your gastroenterologist to clarify.  If you requested that your care partner not be given the details of your procedure findings, then the procedure report has been included in a sealed envelope for you to review at your convenience later.  YOU SHOULD EXPECT: Some feelings of bloating in the abdomen. Passage of more gas than usual.  Walking can help get rid of the air that was put into your GI tract during the procedure and reduce the bloating. If you had a lower endoscopy (such as a colonoscopy or flexible sigmoidoscopy) you may notice spotting of blood in your stool or on the toilet paper. If you underwent a bowel prep for your procedure, you may not have a normal bowel movement for a few days.  Please Note:  You might notice some irritation and congestion in your nose or some drainage.  This is from the oxygen used during your procedure.  There is no need for concern and it should clear up in a day or so.  SYMPTOMS TO REPORT IMMEDIATELY:   Following lower endoscopy (colonoscopy or flexible sigmoidoscopy):  Excessive amounts of blood in the stool  Significant tenderness or worsening of abdominal pains  Swelling of the abdomen that is new, acute  Fever of 100F or higher  For urgent or emergent issues, a gastroenterologist can be reached at any hour by calling [phone]. Do not use MyChart messaging for urgent concerns.    DIET:  We do recommend a small meal at first, but then you may proceed to your regular diet.  Drink plenty of fluids but you should avoid alcoholic beverages for 24 hours.  ACTIVITY:  You should plan to take it easy for the rest of today and you should NOT DRIVE or use heavy machinery until tomorrow (because  of the sedation medicines used during the test).    FOLLOW UP: Our staff will call the number listed on your records 48-72 hours following your procedure to check on you and address any questions or concerns that you may have regarding the information given to you following your procedure. If we do not reach you, we will leave a message.  We will attempt to reach you two times.  During this call, we will ask if you have developed any symptoms of COVID 19. If you develop any symptoms (ie: fever, flu-like symptoms, shortness of breath, cough etc.) before then, please call [phone].  If you test positive for Covid 19 in the 2 weeks post procedure, please call and report this information to us.    If any biopsies were taken you will be contacted by phone or by letter within the next 1-3 weeks.  Please call us at [phone] if you have not heard about the biopsies in 3 weeks.    SIGNATURES/CONFIDENTIALITY: You and/or your care partner have signed paperwork which will be entered into your electronic medical record.  These signatures attest to the fact that that the information above on your After Visit Summary has been reviewed and is understood.  Full responsibility of the confidentiality of this discharge information lies with you and/or your care-partner. 

## 2021-04-12 ENCOUNTER — Telehealth: Payer: Self-pay

## 2021-04-12 NOTE — Telephone Encounter (Signed)
  Follow up Call-  Call back number 04/08/2021  Post procedure Call Back phone  # [phone]  Permission to leave phone message Yes  Some recent data might be hidden     Patient questions:  Do you have a fever, pain , or abdominal swelling? No. Pain Score  0 *  Have you tolerated food without any problems? Yes.    Have you been able to return to your normal activities? Yes.    Do you have any questions about your discharge instructions: Diet   No. Medications  No. Follow up visit  No.  Do you have questions or concerns about your Care? No.  Actions: * If pain score is 4 or above: No action needed, pain <4.

## 2021-06-17 ENCOUNTER — Ambulatory Visit: Payer: 59 | Admitting: Gastroenterology
